# Patient Record
Sex: Male | Born: 1990 | Hispanic: No | Marital: Single | Smoking: Never smoker
Health system: Southern US, Community
[De-identification: ages and names within clinical notes are randomized; demographics above are authoritative.]

---
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
## 2011-03-12 ENCOUNTER — Institutional Professional Consult (permissible substitution) (INDEPENDENT_AMBULATORY_CARE_PROVIDER_SITE_OTHER): Payer: BC Managed Care – PPO | Admitting: Pediatrics

## 2011-03-12 DIAGNOSIS — F909 Attention-deficit hyperactivity disorder, unspecified type: Secondary | ICD-10-CM

## 2011-07-07 ENCOUNTER — Institutional Professional Consult (permissible substitution) (INDEPENDENT_AMBULATORY_CARE_PROVIDER_SITE_OTHER): Payer: BC Managed Care – PPO | Admitting: Pediatrics

## 2011-07-07 DIAGNOSIS — R279 Unspecified lack of coordination: Secondary | ICD-10-CM

## 2011-07-07 DIAGNOSIS — F909 Attention-deficit hyperactivity disorder, unspecified type: Secondary | ICD-10-CM

## 2013-10-26 ENCOUNTER — Other Ambulatory Visit (HOSPITAL_COMMUNITY): Payer: Self-pay | Admitting: Orthopaedic Surgery

## 2013-10-26 ENCOUNTER — Ambulatory Visit (HOSPITAL_COMMUNITY)
Admission: RE | Admit: 2013-10-26 | Discharge: 2013-10-26 | Disposition: A | Discharge status: Home / Self Care | Payer: BC Managed Care – PPO | Source: Ambulatory Visit | Attending: Orthopaedic Surgery | Admitting: Orthopaedic Surgery

## 2013-10-26 DIAGNOSIS — M25511 Pain in right shoulder: Secondary | ICD-10-CM

## 2013-10-26 DIAGNOSIS — Z1389 Encounter for screening for other disorder: Secondary | ICD-10-CM | POA: Insufficient documentation

## 2013-10-26 DIAGNOSIS — M25519 Pain in unspecified shoulder: Secondary | ICD-10-CM | POA: Insufficient documentation

## 2014-09-19 IMAGING — CR DG ORBITS FOR FOREIGN BODY
2 series · 2 of 2 positions shown · non-contrast
Comparison: none

CLINICAL DATA: Shoulder pain, pre MRI

EXAM:
ORBITS FOR FOREIGN BODY - 2 VIEW

[w waters (1 of 2)]
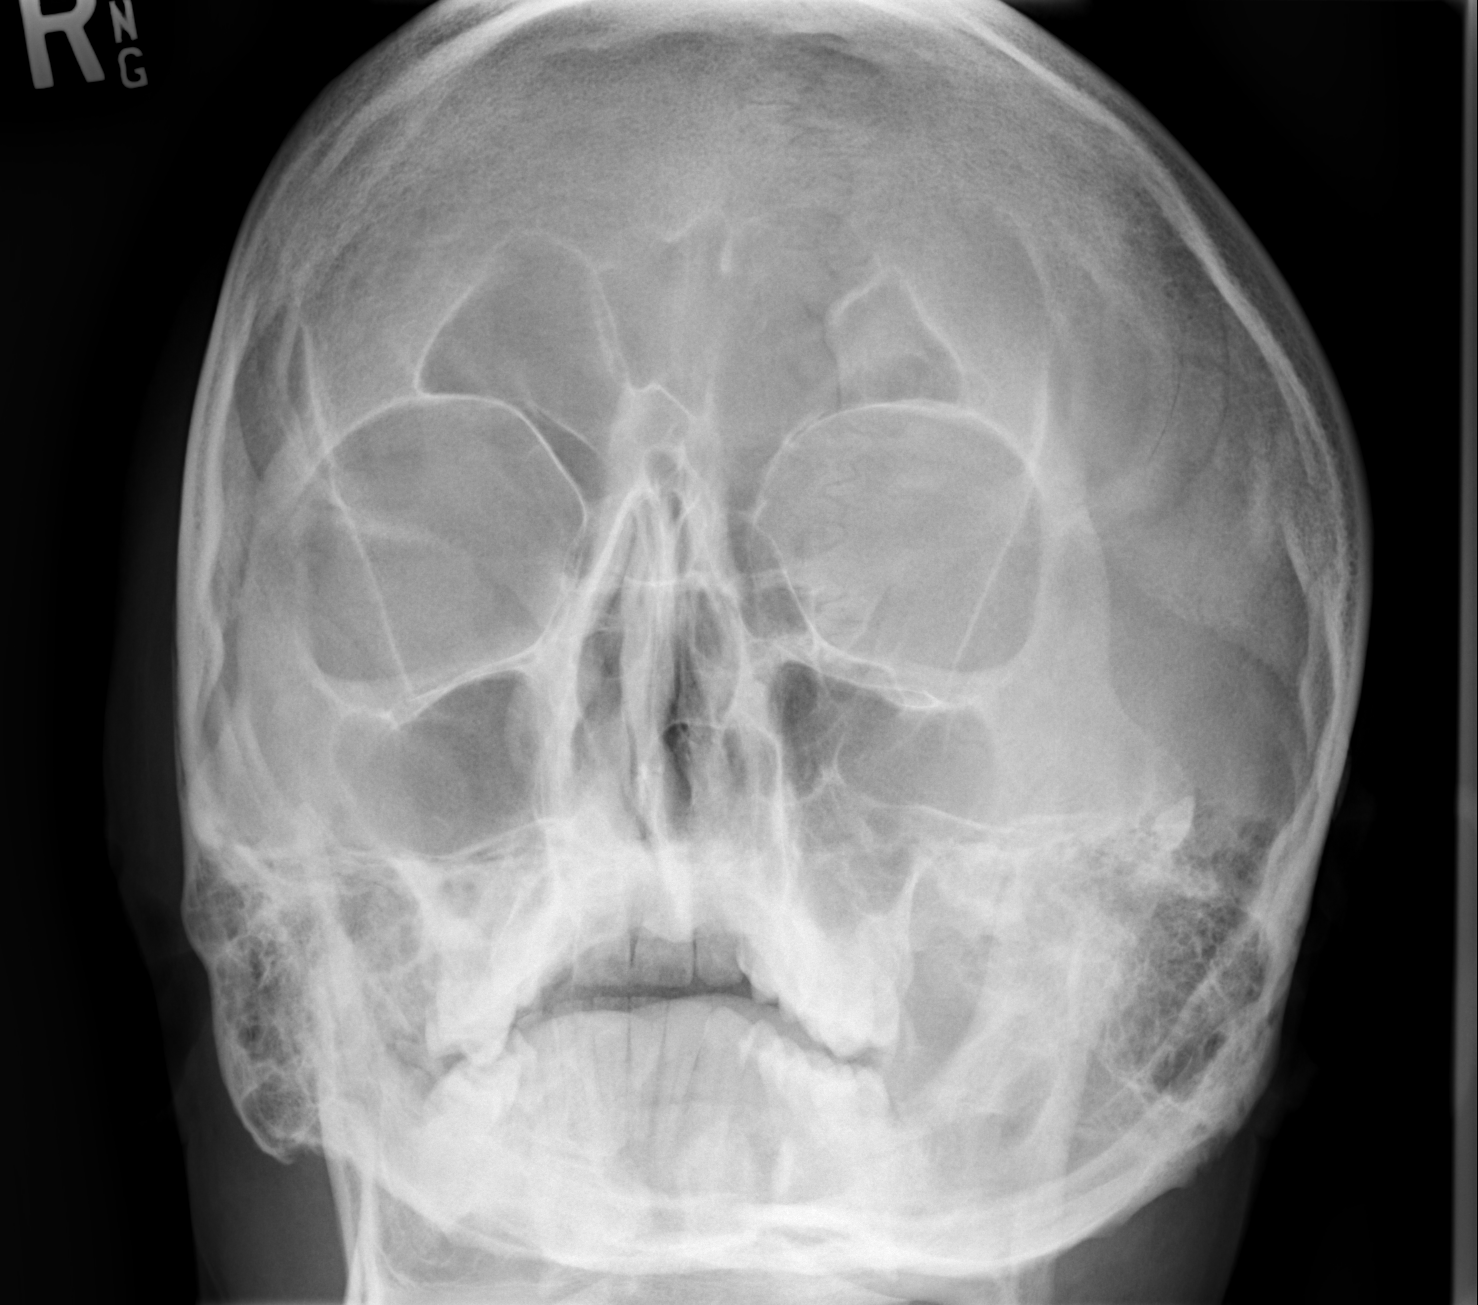

[w waters (2 of 2)]
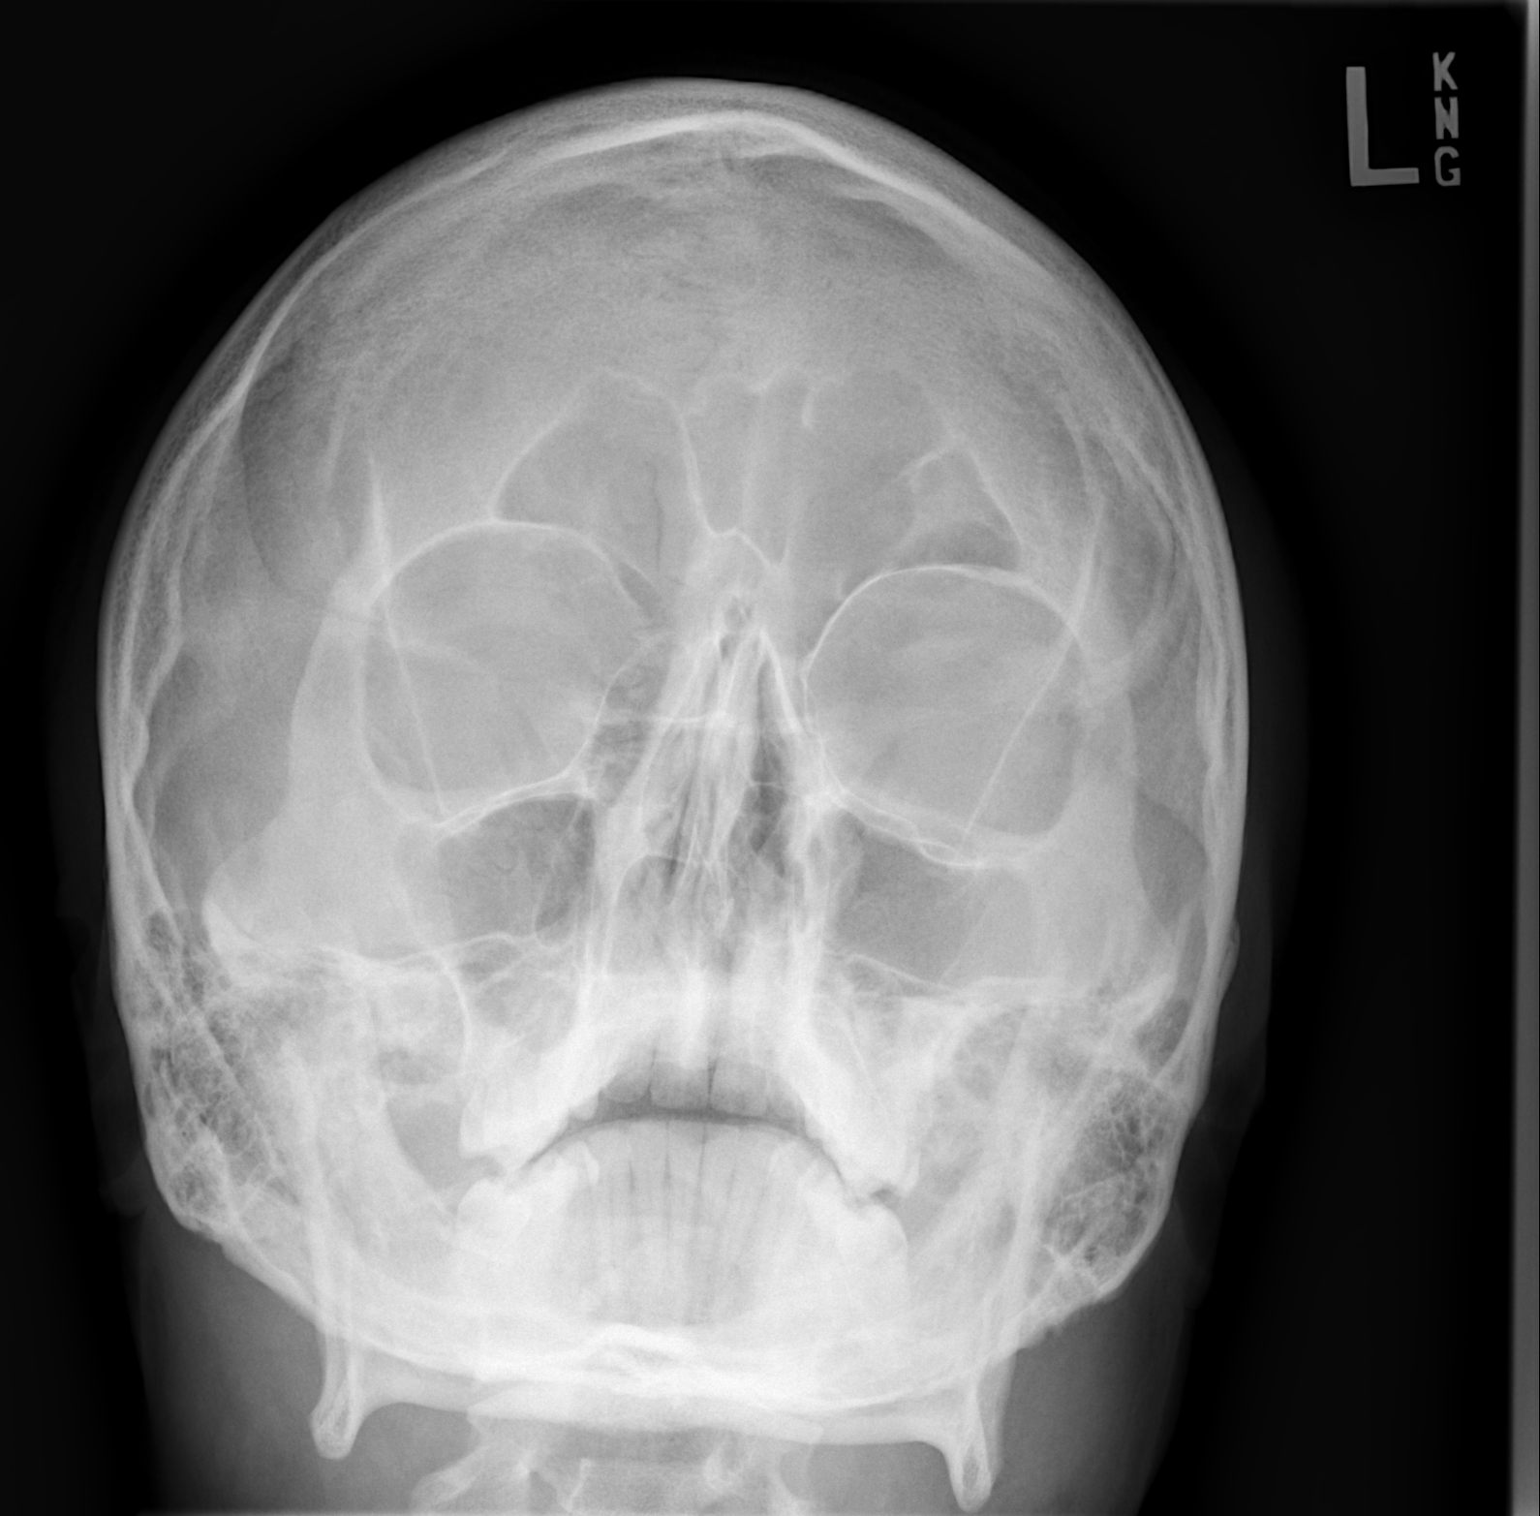

[2 of 2 positions shown; findings below may reference images not displayed]

FINDINGS: There is no evidence of metallic foreign body within the orbits. No
significant bone abnormality identified.
IMPRESSION: No evidence of metallic foreign body within the orbits.

## 2016-09-07 DIAGNOSIS — Z23 Encounter for immunization: Secondary | ICD-10-CM | POA: Diagnosis not present

## 2016-09-07 DIAGNOSIS — Z1389 Encounter for screening for other disorder: Secondary | ICD-10-CM | POA: Diagnosis not present

## 2016-09-07 DIAGNOSIS — Z6829 Body mass index (BMI) 29.0-29.9, adult: Secondary | ICD-10-CM | POA: Diagnosis not present

## 2016-09-07 DIAGNOSIS — F418 Other specified anxiety disorders: Secondary | ICD-10-CM | POA: Diagnosis not present

## 2017-03-18 DIAGNOSIS — Z6829 Body mass index (BMI) 29.0-29.9, adult: Secondary | ICD-10-CM | POA: Diagnosis not present

## 2017-03-18 DIAGNOSIS — E663 Overweight: Secondary | ICD-10-CM | POA: Diagnosis not present

## 2017-03-18 DIAGNOSIS — Z1389 Encounter for screening for other disorder: Secondary | ICD-10-CM | POA: Diagnosis not present

## 2017-03-18 DIAGNOSIS — F951 Chronic motor or vocal tic disorder: Secondary | ICD-10-CM | POA: Diagnosis not present

## 2017-03-18 DIAGNOSIS — F419 Anxiety disorder, unspecified: Secondary | ICD-10-CM | POA: Diagnosis not present

## 2017-08-05 DIAGNOSIS — F909 Attention-deficit hyperactivity disorder, unspecified type: Secondary | ICD-10-CM | POA: Diagnosis not present

## 2017-08-05 DIAGNOSIS — E6609 Other obesity due to excess calories: Secondary | ICD-10-CM | POA: Diagnosis not present

## 2017-08-05 DIAGNOSIS — Z1389 Encounter for screening for other disorder: Secondary | ICD-10-CM | POA: Diagnosis not present

## 2017-08-05 DIAGNOSIS — Z6830 Body mass index (BMI) 30.0-30.9, adult: Secondary | ICD-10-CM | POA: Diagnosis not present

## 2017-11-04 DIAGNOSIS — E663 Overweight: Secondary | ICD-10-CM | POA: Diagnosis not present

## 2017-11-04 DIAGNOSIS — Z1389 Encounter for screening for other disorder: Secondary | ICD-10-CM | POA: Diagnosis not present

## 2017-11-04 DIAGNOSIS — Z6829 Body mass index (BMI) 29.0-29.9, adult: Secondary | ICD-10-CM | POA: Diagnosis not present

## 2017-11-04 DIAGNOSIS — F909 Attention-deficit hyperactivity disorder, unspecified type: Secondary | ICD-10-CM | POA: Diagnosis not present

## 2018-03-24 DIAGNOSIS — Z0000 Encounter for general adult medical examination without abnormal findings: Secondary | ICD-10-CM | POA: Diagnosis not present

## 2018-03-24 DIAGNOSIS — R739 Hyperglycemia, unspecified: Secondary | ICD-10-CM | POA: Diagnosis not present

## 2018-03-24 DIAGNOSIS — Z1389 Encounter for screening for other disorder: Secondary | ICD-10-CM | POA: Diagnosis not present

## 2018-03-24 DIAGNOSIS — E663 Overweight: Secondary | ICD-10-CM | POA: Diagnosis not present

## 2018-03-24 DIAGNOSIS — E782 Mixed hyperlipidemia: Secondary | ICD-10-CM | POA: Diagnosis not present

## 2018-03-24 DIAGNOSIS — Z6828 Body mass index (BMI) 28.0-28.9, adult: Secondary | ICD-10-CM | POA: Diagnosis not present

## 2018-04-19 ENCOUNTER — Other Ambulatory Visit (HOSPITAL_COMMUNITY): Payer: Self-pay | Admitting: Family Medicine

## 2018-04-19 ENCOUNTER — Ambulatory Visit (HOSPITAL_COMMUNITY)
Admission: RE | Admit: 2018-04-19 | Discharge: 2018-04-19 | Disposition: A | Discharge status: Home / Self Care | Payer: 59 | Source: Ambulatory Visit | Attending: Family Medicine | Admitting: Family Medicine

## 2018-04-19 DIAGNOSIS — N433 Hydrocele, unspecified: Secondary | ICD-10-CM | POA: Insufficient documentation

## 2018-04-19 DIAGNOSIS — N5082 Scrotal pain: Secondary | ICD-10-CM | POA: Diagnosis not present

## 2018-04-19 DIAGNOSIS — E663 Overweight: Secondary | ICD-10-CM | POA: Diagnosis not present

## 2018-04-19 DIAGNOSIS — Z1389 Encounter for screening for other disorder: Secondary | ICD-10-CM | POA: Diagnosis not present

## 2018-04-19 DIAGNOSIS — N432 Other hydrocele: Secondary | ICD-10-CM | POA: Diagnosis not present

## 2018-05-11 DIAGNOSIS — E663 Overweight: Secondary | ICD-10-CM | POA: Diagnosis not present

## 2018-05-11 DIAGNOSIS — Z6828 Body mass index (BMI) 28.0-28.9, adult: Secondary | ICD-10-CM | POA: Diagnosis not present

## 2018-06-13 DIAGNOSIS — S0502XA Injury of conjunctiva and corneal abrasion without foreign body, left eye, initial encounter: Secondary | ICD-10-CM | POA: Diagnosis not present

## 2019-03-13 IMAGING — US US SCROTUM W/ DOPPLER COMPLETE
1 series · 14 of 25 positions shown · non-contrast
Comparison: None.

CLINICAL DATA: Right scrotal pain for 1 week.

EXAM:
SCROTAL ULTRASOUND
DOPPLER ULTRASOUND OF THE TESTICLES
TECHNIQUE: Complete ultrasound examination of the testicles, epididymis, and
other scrotal structures was performed. Color and spectral Doppler
ultrasound were also utilized to evaluate blood flow to the
testicles.

[Series 1: us scrotum w/ doppler complete · 14 of 80 slices shown]
[im 1/80]
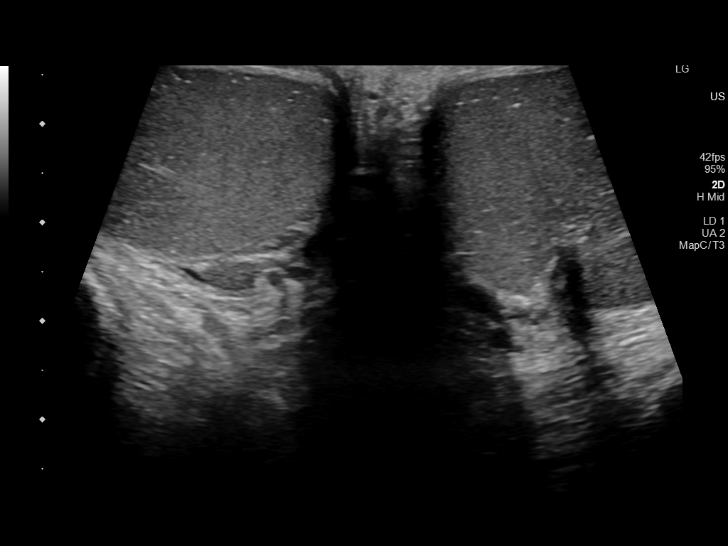
[im 7/80]
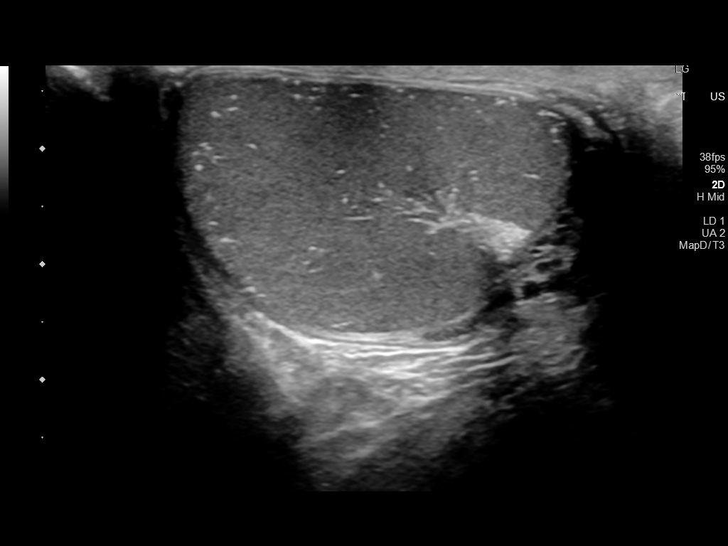
[im 14/80]
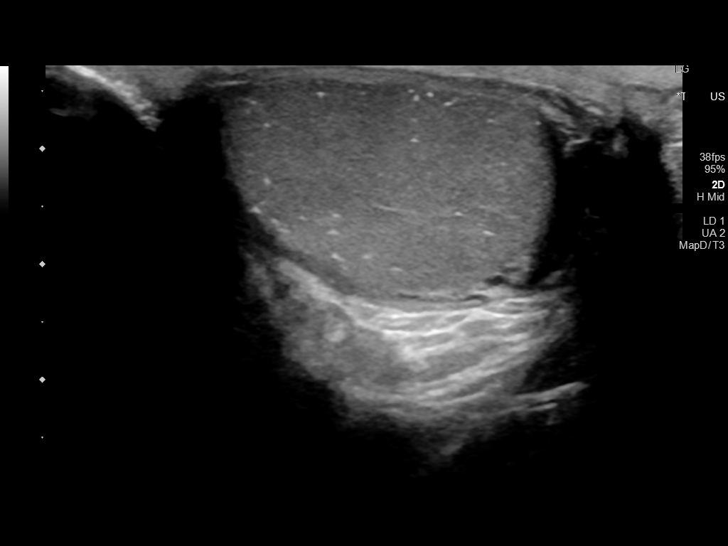
[im 20/80]
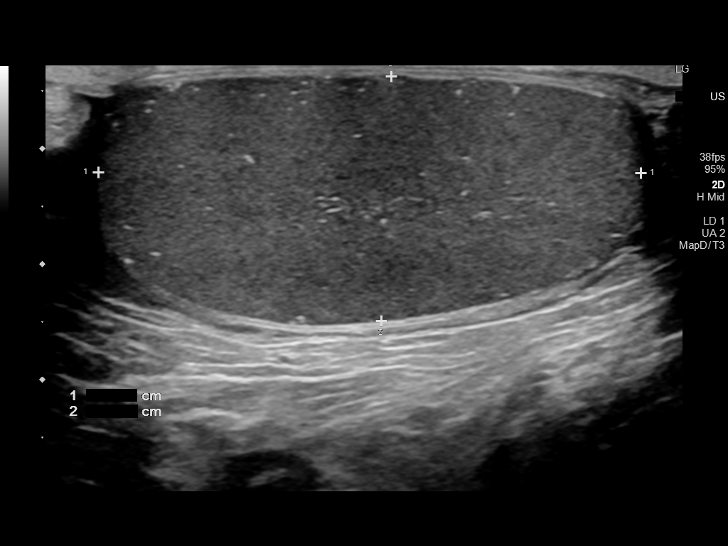
[im 27/80]
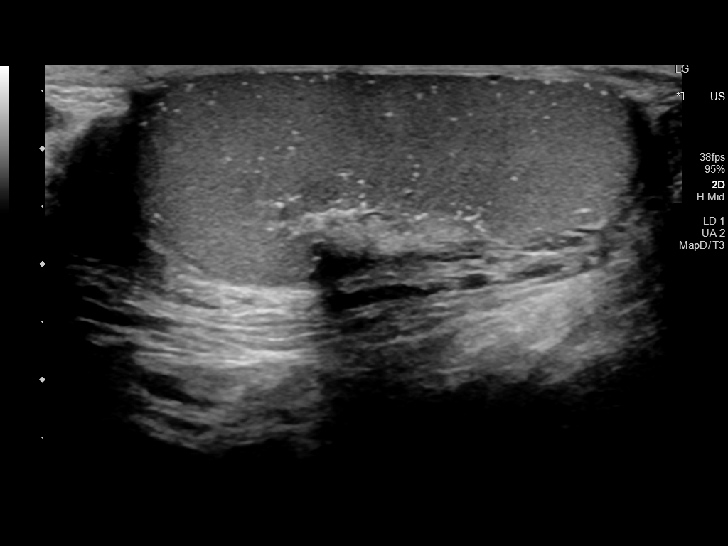
[im 30/80]
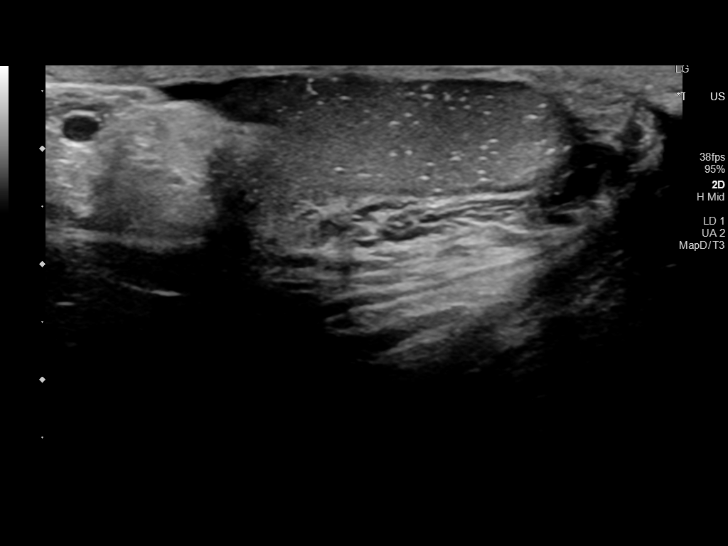
[im 37/80]
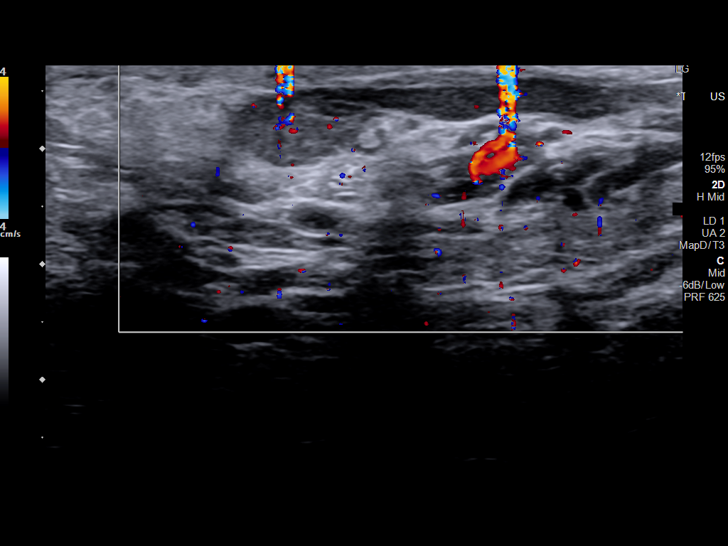
[im 43/80]
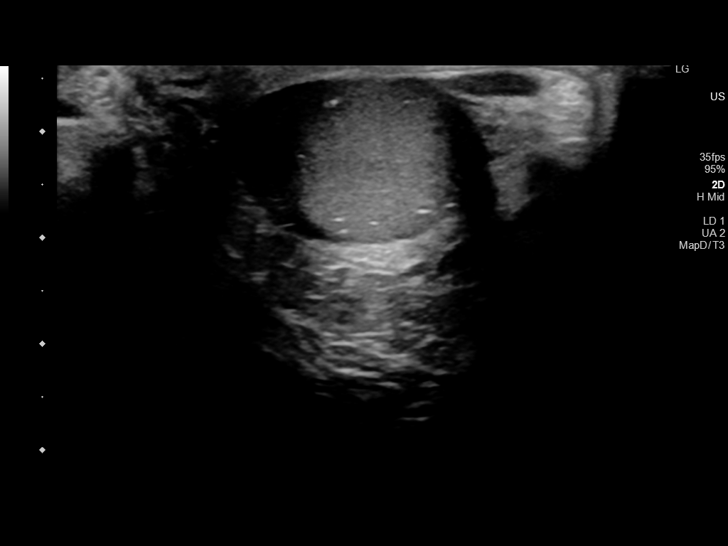
[im 50/80]
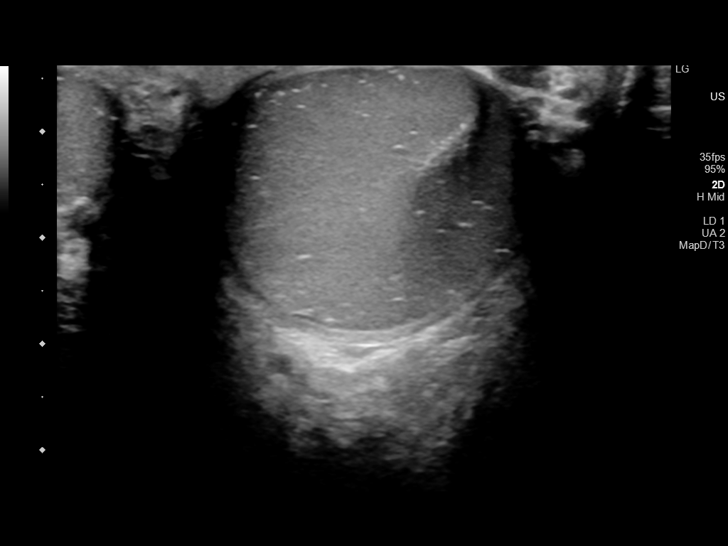
[im 53/80]
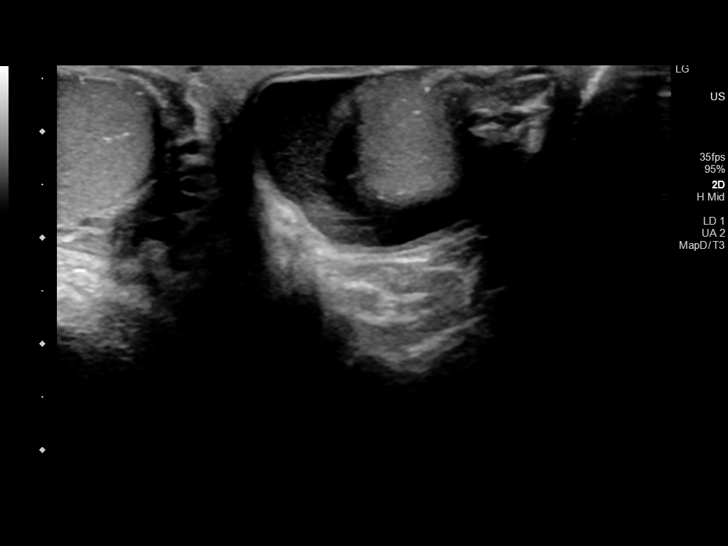
[im 60/80]
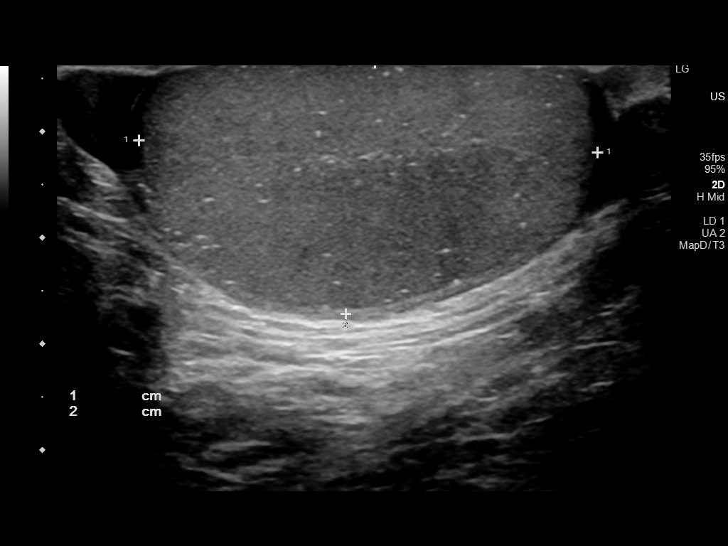
[im 66/80]
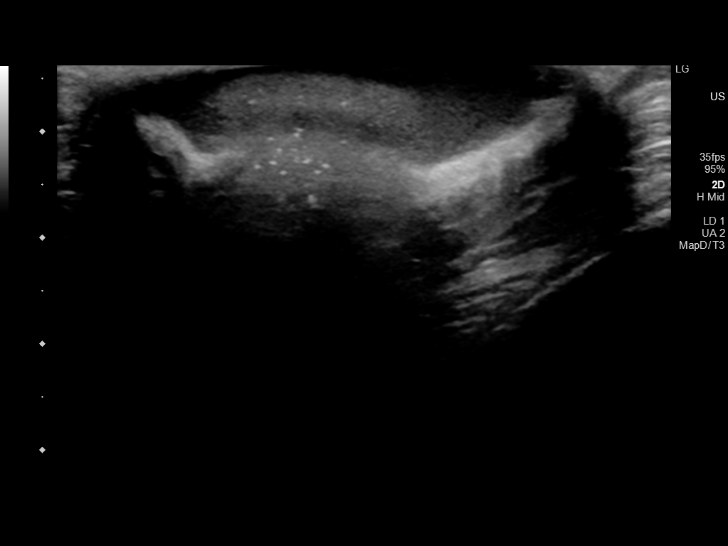
[im 73/80]
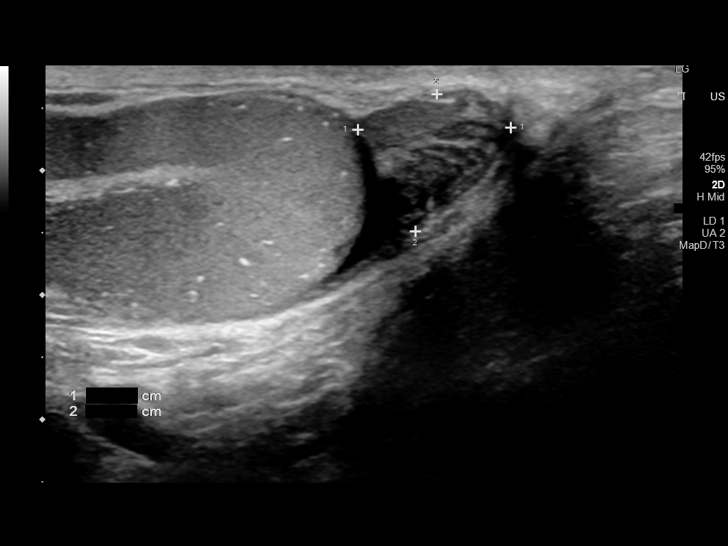
[im 80/80]
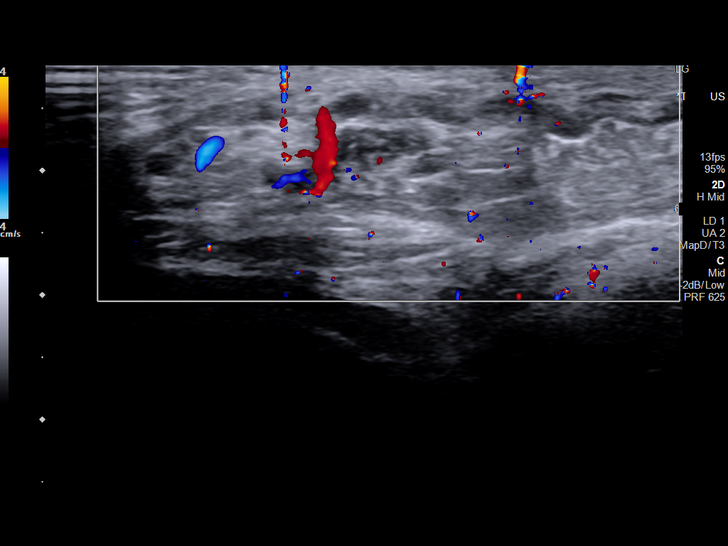

[14 of 25 positions shown; findings below may reference images not displayed]

FINDINGS: Right testicle

Measurements: 4.7 x 2.1 x 3.4 cm. No mass. Diffuse microlithiasis
noted.

Left testicle

Measurements: 4.3 x 2.4 x 2.8 cm. No mass. Diffuse microlithiasis
visualized.

Right epididymis:  Normal in size and appearance.

Left epididymis:  Normal in size and appearance.

Hydrocele:  Bilateral small hydrocele.

Varicocele:  None visualized.

Pulsed Doppler interrogation of both testes demonstrates normal low
resistance arterial and venous waveforms bilaterally.
IMPRESSION: Bilateral testicular microlithiasis.

Small bilateral hydrocele.

## 2019-04-12 ENCOUNTER — Ambulatory Visit (INDEPENDENT_AMBULATORY_CARE_PROVIDER_SITE_OTHER): Payer: 59 | Admitting: Adult Health

## 2019-04-12 ENCOUNTER — Other Ambulatory Visit: Payer: Self-pay

## 2019-04-12 ENCOUNTER — Encounter: Payer: Self-pay | Admitting: Adult Health

## 2019-04-12 VITALS — BP 145/89 | HR 72 | Ht 72.0 in | Wt 202.0 lb

## 2019-04-12 DIAGNOSIS — F331 Major depressive disorder, recurrent, moderate: Secondary | ICD-10-CM

## 2019-04-12 DIAGNOSIS — F411 Generalized anxiety disorder: Secondary | ICD-10-CM

## 2019-04-12 DIAGNOSIS — F902 Attention-deficit hyperactivity disorder, combined type: Secondary | ICD-10-CM | POA: Diagnosis not present

## 2019-04-12 DIAGNOSIS — F429 Obsessive-compulsive disorder, unspecified: Secondary | ICD-10-CM | POA: Diagnosis not present

## 2019-04-12 MED ORDER — FLUOXETINE HCL 20 MG PO CAPS: 20.0000 mg | ORAL_CAPSULE | Freq: Every day | ORAL | 2 refills | Status: DC

## 2019-04-12 NOTE — Progress Notes (Signed)
[facility] MD/PA/NP Initial Note  04/12/2019 8:45 AM Nicholas Bruce  MRN:  [id number]  Chief Complaint:  Chief Complaint    ADHD; Anxiety; Other     Visit Diagnosis: OCD, anxiety, ADHD  HPI:   Describes mood today as "ok". Pleasant. Mood symptoms - reports depression, anxiety, and irritability, but mostly because of his "OCD". Checking doors at night - "will check it 10 times in a row". Pulls eyelashes out - has been doing it since he got married. Stating "I have none". Squinting eyes because he thinks they are dry. Wife notes he makes noises' History of biting nails. After washing hands, makes sure faucets are off. Counts to 12 when "messing with face". Denies any ritual or strict routines. Having issues focusing on son - "wife getting frustrated with me". Stable interest and motivation. Taking medications as prescribed.  Energy levels mostly stable. Active, has a regular exercise routine - running - weight lifting. Works as an Warden/rangerengineering inspector.  Enjoys some usual interests and activities. Spending time with family - wife and 27 year old son - dog. Playing video games.  Appetite adequate. Weight stable. Sleeps well most nights. Averages 6 to 7 hours. Focus and concentration difficulties. Diagnosed with ADHD when 15. Has taken various medications over the years. Last trialed on 54mg  of Concerta with PCP. Stopped taking 7 to 8 months ago. Completing tasks. Managing aspects of household.  Denies SI or HI. Denies AH or VH.   Past Psychiatric History: Denies  Past Medical History: No past medical history on file.   Family Psychiatric History: Father - OCD  Family History: History reviewed. No pertinent family history.  Social History:  Social History   Socioeconomic History  . Marital status: Single    Spouse name: Not on file  . Number of children: Not on file  . Years of education: Not on file  . Highest education level: Not on file  Occupational History  . Not on file   Social Needs  . Financial resource strain: Not on file  . Food insecurity    Worry: Not on file    Inability: Not on file  . Transportation needs    Medical: Not on file    Non-medical: Not on file  Tobacco Use  . Smoking status: Never Smoker  . Smokeless tobacco: Never Used  Substance and Sexual Activity  . Alcohol use: Not on file  . Drug use: Not on file  . Sexual activity: Not on file  Lifestyle  . Physical activity    Days per week: Not on file    Minutes per session: Not on file  . Stress: Not on file  Relationships  . Social Musicianconnections    Talks on phone: Not on file    Gets together: Not on file    Attends religious service: Not on file    Active member of club or organization: Not on file    Attends meetings of clubs or organizations: Not on file    Relationship status: Not on file  Other Topics Concern  . Not on file  Social History Narrative  . Not on file    Allergies: No Known Allergies  Metabolic Disorder Labs: No results found for: HGBA1C, MPG No results found for: PROLACTIN No results found for: CHOL, TRIG, HDL, CHOLHDL, VLDL, LDLCALC No results found for: TSH  Therapeutic Level Labs: No results found for: LITHIUM No results found for: VALPROATE No components found for:  CBMZ  Current Medications: No  current outpatient medications on file.   No current facility-administered medications for this visit.     Medication Side Effects: none  Orders placed this visit:  No orders of the defined types were placed in this encounter.   Psychiatric Specialty Exam:  ROS  Blood pressure (!) 145/89, pulse 72, height 6' (1.829 m), weight 202 lb (91.6 kg).Body mass index is 27.4 kg/m.  General Appearance: Neat and Well Groomed  Eye Contact:  Good  Speech:  Normal Rate  Volume:  Normal  Mood:  Anxious, Depressed and Irritable  Affect:  Non-Congruent  Thought Process:  Coherent  Orientation:  Full (Time, Place, and Person)  Thought Content:  Logical   Suicidal Thoughts:  No  Homicidal Thoughts:  No  Memory:  WNL  Judgement:  Good  Insight:  Negative  Psychomotor Activity:  Normal  Concentration:  Concentration: Good  Recall:  Good  Fund of Knowledge: Good  Language: Good  Assets:  Communication Skills Desire for Improvement Financial Resources/Insurance Housing Intimacy Leisure Time Physical Health Resilience Social Support Talents/Skills Transportation Vocational/Educational  ADL's:  Intact  Cognition: WNL  Prognosis:  Good   Screenings: None  Receiving Psychotherapy: No   Treatment Plan/Recommendations:  Plan:  1. Add Prozac 20mg  capsule   RTC 4 weeks  Patient advised to contact office with any questions, adverse effects, or acute worsening in signs and symptoms.    Aloha Gell, NP

## 2019-05-10 ENCOUNTER — Encounter: Payer: Self-pay | Admitting: Adult Health

## 2019-05-10 ENCOUNTER — Other Ambulatory Visit: Payer: Self-pay

## 2019-05-10 ENCOUNTER — Ambulatory Visit (INDEPENDENT_AMBULATORY_CARE_PROVIDER_SITE_OTHER): Payer: 59 | Admitting: Adult Health

## 2019-05-10 DIAGNOSIS — F902 Attention-deficit hyperactivity disorder, combined type: Secondary | ICD-10-CM

## 2019-05-10 DIAGNOSIS — F411 Generalized anxiety disorder: Secondary | ICD-10-CM

## 2019-05-10 DIAGNOSIS — F429 Obsessive-compulsive disorder, unspecified: Secondary | ICD-10-CM

## 2019-05-10 DIAGNOSIS — F331 Major depressive disorder, recurrent, moderate: Secondary | ICD-10-CM

## 2019-05-10 MED ORDER — FLUOXETINE HCL 40 MG PO CAPS: 40.0000 mg | ORAL_CAPSULE | Freq: Every day | ORAL | 2 refills | Status: DC

## 2019-05-10 NOTE — Progress Notes (Signed)
[facility] MD/PA/NP Progress Note  05/10/2019 8:19 AM Nicholas Bruce  MRN:  [id number]  Chief Complaint:  Chief Complaint    ADHD; Anxiety; Depression; Other     Visit Diagnosis: OCD, anxiety, ADHD.  HPI:   Describes mood today as "ok". Pleasant. Mood symptoms - reports decreased depression, anxiety, and irritability.Things going "pretty well". Wife has noticed some differences. Feels better "not so worrisome". Had headaches first few days, then "fine". Finds himself not checking on doors as much. Does still count - "nothing like it was. Has stopped pulling out his eyelashes. Decreased "tics". Squinting eyes "here and there". Has stopped biting nails. Has been able to focus on son more. Stable interest and motivation. Taking medications as prescribed.  Energy levels mostly stable. Active, has a regular exercise routine - going to the gym every day.  Works as an Electrical engineer.  Enjoys some usual interests and activities. Spending time with family - wife and 49 year old son - dog.  Appetite adequate. Weight stable. Sleeps well most nights. Averages 7 to 8 hours. Focus and concentration difficulties. Diagnosed with ADHD when 15. Has taken various medications over the years. Completing tasks. Managing aspects of household. Work going well.  Denies SI or HI. Denies AH or VH.  Past Psychiatric History: Denies  Past Medical History: History reviewed. No pertinent past medical history.   Family Psychiatric History: Father - OCD  Family History: History reviewed. No pertinent family history.  Social History:  Social History   Socioeconomic History  . Marital status: Single    Spouse name: Not on file  . Number of children: Not on file  . Years of education: Not on file  . Highest education level: Not on file  Occupational History  . Not on file  Social Needs  . Financial resource strain: Not on file  . Food insecurity    Worry: Not on file    Inability: Not on file  .  Transportation needs    Medical: Not on file    Non-medical: Not on file  Tobacco Use  . Smoking status: Never Smoker  . Smokeless tobacco: Never Used  Substance and Sexual Activity  . Alcohol use: Not on file  . Drug use: Not on file  . Sexual activity: Not on file  Lifestyle  . Physical activity    Days per week: Not on file    Minutes per session: Not on file  . Stress: Not on file  Relationships  . Social Herbalist on phone: Not on file    Gets together: Not on file    Attends religious service: Not on file    Active member of club or organization: Not on file    Attends meetings of clubs or organizations: Not on file    Relationship status: Not on file  Other Topics Concern  . Not on file  Social History Narrative  . Not on file    Allergies: No Known Allergies  Metabolic Disorder Labs: No results found for: HGBA1C, MPG No results found for: PROLACTIN No results found for: CHOL, TRIG, HDL, CHOLHDL, VLDL, LDLCALC No results found for: TSH  Therapeutic Level Labs: No results found for: LITHIUM No results found for: VALPROATE No components found for:  CBMZ  Current Medications: Current Outpatient Medications  Medication Sig Dispense Refill  . FLUoxetine (PROZAC) 40 MG capsule Take 1 capsule (40 mg total) by mouth daily. 30 capsule 2   No current facility-administered medications for  this visit.     Medication Side Effects: none  Orders placed this visit:  No orders of the defined types were placed in this encounter.   Psychiatric Specialty Exam:  ROS  There were no vitals taken for this visit.There is no height or weight on file to calculate BMI.  General Appearance: Neat and Well Groomed  Eye Contact:  Good  Speech:  Normal Rate  Volume:  Normal  Mood:  Euthymic  Affect:  Non-Congruent  Thought Process:  Coherent  Orientation:  Full (Time, Place, and Person)  Thought Content: Logical   Suicidal Thoughts:  No  Homicidal Thoughts:  No   Memory:  WNL  Judgement:  Good  Insight:  Negative  Psychomotor Activity:  Normal  Concentration:  Concentration: Good  Recall:  Good  Fund of Knowledge: Good  Language: Good  Assets:  Communication Skills Desire for Improvement Financial Resources/Insurance Housing Intimacy Leisure Time Physical Health Resilience Social Support Talents/Skills Transportation Vocational/Educational  ADL's:  Intact  Cognition: WNL  Prognosis:  Good   Screenings: None  Receiving Psychotherapy: No   Treatment Plan/Recommendations:  Plan:  1. Increase Prozac 20mg  to 40mg  capsule daily  RTC 4 weeks  Patient advised to contact office with any questions, adverse effects, or acute worsening in signs and symptoms.    , NP

## 2019-06-07 ENCOUNTER — Other Ambulatory Visit: Payer: Self-pay

## 2019-06-07 ENCOUNTER — Encounter: Payer: Self-pay | Admitting: Adult Health

## 2019-06-07 ENCOUNTER — Ambulatory Visit (INDEPENDENT_AMBULATORY_CARE_PROVIDER_SITE_OTHER): Payer: 59 | Admitting: Adult Health

## 2019-06-07 DIAGNOSIS — F429 Obsessive-compulsive disorder, unspecified: Secondary | ICD-10-CM

## 2019-06-07 DIAGNOSIS — F331 Major depressive disorder, recurrent, moderate: Secondary | ICD-10-CM | POA: Diagnosis not present

## 2019-06-07 DIAGNOSIS — F909 Attention-deficit hyperactivity disorder, unspecified type: Secondary | ICD-10-CM | POA: Diagnosis not present

## 2019-06-07 DIAGNOSIS — F411 Generalized anxiety disorder: Secondary | ICD-10-CM

## 2019-06-07 MED ORDER — FLUOXETINE HCL 40 MG PO CAPS: 40.0000 mg | ORAL_CAPSULE | Freq: Every day | ORAL | 1 refills | Status: DC

## 2019-06-07 NOTE — Progress Notes (Signed)
[facility] MD/PA/NP Progress Note  06/07/2019 8:33 AM Nicholas Bruce  MRN:  [id number]  Chief Complaint:  Chief Complaint    Anxiety; Depression; ADHD; Other     Visit Diagnosis: OCD, anxiety, depression, ADHD.  HPI:   Describes mood today as "ok". Pleasant. Mood symptoms - denies depression, anxiety, and irritability. Stating "I'm doing much better". Not "worrying" as much about things. Not checking on doors.Still "counting here and there" - "maybe once or twice a week". Stopped pulling out his eyelashes. Denies "tics". No longer "squinting eyes". Denies biting nails. Denies panic attacks. Stable interest and motivation. Taking medications as prescribed.  Energy levels mostly stable. Active, has a regular exercise routine - going to the gym 4 to 5 days a week. 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 Spending time with family - wife and 60 year old son - dog. Family local. Upcoming trip to [place].  Appetite adequate. Weight stable. Sleeps well most nights. Averages 7 to 8 hours. Focus and concentration difficulties. Diagnosed with ADHD when 15. Completing tasks. Managing aspects of household. Work going well.  Denies SI or HI. Denies AH or VH.  Past Psychiatric History: Denies  Past Medical History: No past medical history on file.   Family Psychiatric History: Father - OCD  Family History: No family history on file.  Social History:  Social History   Socioeconomic History  . Marital status: Single    Spouse name: Not on file  . Number of children: Not on file  . Years of education: Not on file  . Highest education level: Not on file  Occupational History  . Not on file  Social Needs  . Financial resource strain: Not on file  . Food insecurity    Worry: Not on file    Inability: Not on file  . Transportation needs    Medical: Not on file    Non-medical: Not on file  Tobacco Use  . Smoking status: Never Smoker  . Smokeless  tobacco: Never Used  Substance and Sexual Activity  . Alcohol use: Not on file  . Drug use: Not on file  . Sexual activity: Not on file  Lifestyle  . Physical activity    Days per week: Not on file    Minutes per session: Not on file  . Stress: Not on file  Relationships  . Social Musician on phone: Not on file    Gets together: Not on file    Attends religious service: Not on file    Active member of club or organization: Not on file    Attends meetings of clubs or organizations: Not on file    Relationship status: Not on file  Other Topics Concern  . Not on file  Social History Narrative  . Not on file    Allergies: No Known Allergies  Metabolic Disorder Labs: No results found for: HGBA1C, MPG No results found for: PROLACTIN No results found for: CHOL, TRIG, HDL, CHOLHDL, VLDL, LDLCALC No results found for: TSH  Therapeutic Level Labs: No results found for: LITHIUM No results found for: VALPROATE No components found for:  CBMZ  Current Medications: Current Outpatient Medications  Medication Sig Dispense Refill  . FLUoxetine (PROZAC) 40 MG capsule Take 1 capsule (40 mg total) by mouth daily. 30 capsule 2   No current facility-administered medications for this visit.     Medication Side Effects: none  Orders placed this visit:  No orders  of the defined types were placed in this encounter.   Psychiatric Specialty Exam:  Review of Systems  Neurological: Negative for tremors and weakness.    There were no vitals taken for this visit.There is no height or weight on file to calculate BMI.  General Appearance: Neat and Well Groomed  Eye Contact:  Good  Speech:  Normal Rate  Volume:  Normal  Mood:  Euthymic  Affect:  Non-Congruent  Thought Process:  Coherent  Orientation:  Full (Time, Place, and Person)  Thought Content: Logical   Suicidal Thoughts:  No  Homicidal Thoughts:  No  Memory:  WNL  Judgement:  Good  Insight:  Negative  Psychomotor  Activity:  Normal  Concentration:  Concentration: Good  Recall:  Good  Fund of Knowledge: Good  Language: Good  Assets:  Communication Skills Desire for Improvement Financial Resources/Insurance Housing Intimacy Leisure Time Physical Health Resilience Social Support Talents/Skills Transportation Vocational/Educational  ADL's:  Intact  Cognition: WNL  Prognosis:  Good   Screenings: None  Receiving Psychotherapy: No   Treatment Plan/Recommendations:  Plan:  1. Continue Prozac 40mg  capsule daily  RTC 3 months   Patient advised to contact office with any questions, adverse effects, or acute worsening in signs and symptoms.    Aloha Gell, NP

## 2019-09-06 ENCOUNTER — Encounter: Payer: Self-pay | Admitting: Adult Health

## 2019-09-06 ENCOUNTER — Ambulatory Visit (INDEPENDENT_AMBULATORY_CARE_PROVIDER_SITE_OTHER): Payer: 59 | Admitting: Adult Health

## 2019-09-06 ENCOUNTER — Other Ambulatory Visit: Payer: Self-pay

## 2019-09-06 DIAGNOSIS — F902 Attention-deficit hyperactivity disorder, combined type: Secondary | ICD-10-CM | POA: Diagnosis not present

## 2019-09-06 DIAGNOSIS — F429 Obsessive-compulsive disorder, unspecified: Secondary | ICD-10-CM

## 2019-09-06 DIAGNOSIS — F411 Generalized anxiety disorder: Secondary | ICD-10-CM

## 2019-09-06 DIAGNOSIS — F331 Major depressive disorder, recurrent, moderate: Secondary | ICD-10-CM

## 2019-09-06 MED ORDER — FLUOXETINE HCL 20 MG PO CAPS: ORAL_CAPSULE | ORAL | 5 refills | Status: DC

## 2019-09-06 NOTE — Progress Notes (Signed)
Nicholas Bruce [id number] Apr 12, 1991 29 y.o.  Subjective:   Patient ID:  Nicholas Bruce is a 29 y.o. (DOB 12-12-1990) male.  Chief Complaint:  Chief Complaint  Patient presents with  . Anxiety  . Depression  . ADHD  . Other    OCD    HPI   Nicholas Bruce presents to the office today for follow-up of MDD, ADHD, OCD, and ADHD.  Describes mood today as "so ". Pleasant. Mood symptoms - reports depression, anxiety, and irritability. Stating "I'm feeling overwhelmed and stressed right now". Had been feeling great until he started having increased situational stressors. Grandfather passed away yesterday. Concerned about election results. Moving into a new house. Has picked at eyelashes "some". Started "checking" things more than he was. Denies "tics" or "squinting eyes". Denies biting nails. Denies panic attacks. Stable interest and motivation. Taking medications as prescribed.  Energy levels mostly stable. Active, has a regular exercise routine - going to the gym 4 to 5 days a week. 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 Married. Lives with wife and 30 and 61/55 year old son - dog. Family local and supportive. Recent trip to [place].  Appetite adequate. Weight stable - 212. Sleeps well most nights. Averages 7 to 8 hours. Focus and concentration difficulties. Diagnosed with ADHD when 15 - does not won't to take medications. Completing tasks. Managing aspects of household. Work going well.  Denies SI or HI. Denies AH or VH.  Review of Systems:  Review of Systems  Musculoskeletal: Negative for gait problem.  Neurological: Negative for tremors.  Psychiatric/Behavioral:       Please refer to HPI    Medications: I have reviewed the patient's current medications.  Current Outpatient Medications  Medication Sig Dispense Refill  . fluorometholone (FML) 0.1 % ophthalmic suspension 1 drop 3 (three) times daily.    Marland Kitchen FLUoxetine (PROZAC) 20 MG capsule Take  three capsules daily. 90 capsule 5   No current facility-administered medications for this visit.    Medication Side Effects: None  Allergies: No Known Allergies  No past medical history on file.  No family history on file.  Social History   Socioeconomic History  . Marital status: Single    Spouse name: Not on file  . Number of children: Not on file  . Years of education: Not on file  . Highest education level: Not on file  Occupational History  . Not on file  Tobacco Use  . Smoking status: Never Smoker  . Smokeless tobacco: Never Used  Substance and Sexual Activity  . Alcohol use: Not on file  . Drug use: Not on file  . Sexual activity: Not on file  Other Topics Concern  . Not on file  Social History Narrative  . Not on file   Social Determinants of Health   Financial Resource Strain:   . Difficulty of Paying Living Expenses: Not on file  Food Insecurity:   . Worried About Programme researcher, broadcasting/film/video in the Last Year: Not on file  . Ran Out of Food in the Last Year: Not on file  Transportation Needs:   . Lack of Transportation (Medical): Not on file  . Lack of Transportation (Non-Medical): Not on file  Physical Activity:   . Days of Exercise per Week: Not on file  . Minutes of Exercise per Session: Not on file  Stress:   . Feeling of Stress : Not on file  Social Connections:   .  Frequency of Communication with Friends and Family: Not on file  . Frequency of Social Gatherings with Friends and Family: Not on file  . Attends Religious Services: Not on file  . Active Member of Clubs or Organizations: Not on file  . Attends Archivist Meetings: Not on file  . Marital Status: Not on file  Intimate Partner Violence:   . Fear of Current or Ex-Partner: Not on file  . Emotionally Abused: Not on file  . Physically Abused: Not on file  . Sexually Abused: Not on file    Past Medical History, Surgical history, Social history, and Family history were reviewed and  updated as appropriate.   Please see review of systems for further details on the patient's review from today.   Objective:   Physical Exam:  There were no vitals taken for this visit.  Physical Exam Constitutional:      General: He is not in acute distress.    Appearance: He is well-developed.  Musculoskeletal:        General: No deformity.  Neurological:     Mental Status: He is alert and oriented to person, place, and time.     Coordination: Coordination normal.  Psychiatric:        Attention and Perception: Attention and perception normal. He does not perceive auditory or visual hallucinations.        Mood and Affect: Mood normal. Mood is not anxious or depressed. Affect is not labile, blunt, angry or inappropriate.        Speech: Speech normal.        Behavior: Behavior normal.        Thought Content: Thought content normal. Thought content is not paranoid or delusional. Thought content does not include homicidal or suicidal ideation. Thought content does not include homicidal or suicidal plan.        Cognition and Memory: Cognition and memory normal.        Judgment: Judgment normal.     Comments: Insight intact     Lab Review:  No results found for: NA, K, CL, CO2, GLUCOSE, BUN, CREATININE, CALCIUM, PROT, ALBUMIN, AST, ALT, ALKPHOS, BILITOT, GFRNONAA, GFRAA  No results found for: WBC, RBC, HGB, HCT, PLT, MCV, MCH, MCHC, RDW, LYMPHSABS, MONOABS, EOSABS, BASOSABS  No results found for: POCLITH, LITHIUM   No results found for: PHENYTOIN, PHENOBARB, VALPROATE, CBMZ   .res Assessment: Plan:    1. Continue Prozac 40mg  to 60mg  capsule daily - increased OCD symptoms.  RTC 3 months   Patient advised to contact office with any questions, adverse effects, or acute worsening in signs and symptoms.  Aden was seen today for anxiety, depression, adhd and other.  Diagnoses and all orders for this visit:  Attention deficit hyperactivity disorder (ADHD), combined  type  Obsessive-compulsive disorder, unspecified type -     FLUoxetine (PROZAC) 20 MG capsule; Take three capsules daily.  Major depressive disorder, recurrent episode, moderate (HCC) -     FLUoxetine (PROZAC) 20 MG capsule; Take three capsules daily.  Generalized anxiety disorder -     FLUoxetine (PROZAC) 20 MG capsule; Take three capsules daily.     Please see After Visit Summary for patient specific instructions.  Future Appointments  Date Time Provider Ivy  12/05/2019  8:20 AM Elpidia Karn, Berdie Ogren, NP [facility] None    No orders of the defined types were placed in this encounter.   -------------------------------

## 2019-09-17 ENCOUNTER — Ambulatory Visit: Payer: 59 | Attending: Internal Medicine

## 2019-09-17 ENCOUNTER — Other Ambulatory Visit: Payer: Self-pay

## 2019-09-17 DIAGNOSIS — Z20822 Contact with and (suspected) exposure to covid-19: Secondary | ICD-10-CM

## 2019-09-18 ENCOUNTER — Telehealth: Payer: Self-pay

## 2019-09-18 LAB — NOVEL CORONAVIRUS, NAA: SARS-CoV-2, NAA: NOT DETECTED

## 2019-09-18 NOTE — Telephone Encounter (Signed)
Patient called and was told that his test for COVID-19 was negative 09/17/19. He states he was tested because he was around someone with fever and symptoms and they have not received their result. He was advised to continue quarantine until the sick person has received their test result  If they are positive he should continue quarantine for 14 days post exposure to the positive patient.  He verbalized understanding.

## 2019-09-20 ENCOUNTER — Ambulatory Visit: Payer: 59 | Attending: Internal Medicine

## 2019-09-20 ENCOUNTER — Other Ambulatory Visit: Payer: Self-pay

## 2019-09-20 DIAGNOSIS — Z20822 Contact with and (suspected) exposure to covid-19: Secondary | ICD-10-CM

## 2019-09-21 ENCOUNTER — Telehealth: Payer: Self-pay | Admitting: *Deleted

## 2019-09-21 LAB — NOVEL CORONAVIRUS, NAA: SARS-CoV-2, NAA: NOT DETECTED

## 2019-09-21 NOTE — Telephone Encounter (Signed)
Pt called for COVID test results, informed that lab shows Not Detected. Questions answered, pt verbalizes understanding.  

## 2019-12-05 ENCOUNTER — Encounter: Payer: Self-pay | Admitting: Adult Health

## 2019-12-05 ENCOUNTER — Ambulatory Visit (INDEPENDENT_AMBULATORY_CARE_PROVIDER_SITE_OTHER): Payer: 59 | Admitting: Adult Health

## 2019-12-05 ENCOUNTER — Other Ambulatory Visit: Payer: Self-pay

## 2019-12-05 DIAGNOSIS — F429 Obsessive-compulsive disorder, unspecified: Secondary | ICD-10-CM

## 2019-12-05 DIAGNOSIS — F331 Major depressive disorder, recurrent, moderate: Secondary | ICD-10-CM

## 2019-12-05 DIAGNOSIS — F411 Generalized anxiety disorder: Secondary | ICD-10-CM | POA: Diagnosis not present

## 2019-12-05 MED ORDER — FLUOXETINE HCL 20 MG PO CAPS: ORAL_CAPSULE | ORAL | 1 refills | Status: DC

## 2019-12-05 NOTE — Progress Notes (Signed)
Nicholas Bruce [id number] 08/23/1990 29 y.o.  Subjective:   Patient ID:  Nicholas Bruce is a 29 y.o. (DOB 07-23-1991) male.  Chief Complaint: No chief complaint on file.   HPI Nicholas Bruce presents to the office today for follow-up of MDD, ADHD, OCD, and ADHD.  Describes mood today as "so ". Pleasant. Mood symptoms - denies depression, anxiety, and irritability. Denies panic attacks. Stating "I'm feeling pretty good, everything has sorted itself out". Recently moved into a new home. Wife [redacted] weeks pregnant. Has not been picking at eyelashes. No longer checking things - "other than normal". Denies "tics" or "squinting eyes". Denies biting nails. Stable interest and motivation. Taking medications as prescribed.  Energy levels mostly stable. Active, has a regular exercise routine - going to the gym 4 to 5 days a week. Works as an Electrical engineer.  Enjoys some usual interests and activities. Married. Lives with wife and 47 and 33/79 year old son - dog. Family local and supportive.  Appetite adequate. Weight stable - 212. Sleeps well most nights. Averages 7 to 8 hours. Focus and concentration difficulties. Diagnosed with ADHD when 15 - does not won't to take medications. Completing tasks. Managing aspects of household. Work going well.  Denies SI or HI. Denies AH or VH.    Review of Systems:  Review of Systems  Musculoskeletal: Negative for gait problem.  Neurological: Negative for tremors.  Psychiatric/Behavioral:       Please refer to HPI    Medications: I have reviewed the patient's current medications.  Current Outpatient Medications  Medication Sig Dispense Refill  . fluorometholone (FML) 0.1 % ophthalmic suspension 1 drop 3 (three) times daily.    Marland Kitchen FLUoxetine (PROZAC) 20 MG capsule Take three capsules daily. 90 capsule 5   No current facility-administered medications for this visit.    Medication Side Effects: None  Allergies: No Known Allergies  No past medical history on  file.  No family history on file.  Social History   Socioeconomic History  . Marital status: Single    Spouse name: Not on file  . Number of children: Not on file  . Years of education: Not on file  . Highest education level: Not on file  Occupational History  . Not on file  Tobacco Use  . Smoking status: Never Smoker  . Smokeless tobacco: Never Used  Substance and Sexual Activity  . Alcohol use: Not on file  . Drug use: Not on file  . Sexual activity: Not on file  Other Topics Concern  . Not on file  Social History Narrative  . Not on file   Social Determinants of Health   Financial Resource Strain:   . Difficulty of Paying Living Expenses:   Food Insecurity:   . Worried About Charity fundraiser in the Last Year:   . Arboriculturist in the Last Year:   Transportation Needs:   . Film/video editor (Medical):   Marland Kitchen Lack of Transportation (Non-Medical):   Physical Activity:   . Days of Exercise per Week:   . Minutes of Exercise per Session:   Stress:   . Feeling of Stress :   Social Connections:   . Frequency of Communication with Friends and Family:   . Frequency of Social Gatherings with Friends and Family:   . Attends Religious Services:   . Active Member of Clubs or Organizations:   . Attends Archivist Meetings:   Marland Kitchen Marital Status:  Intimate Partner Violence:   . Fear of Current or Ex-Partner:   . Emotionally Abused:   Marland Kitchen Physically Abused:   . Sexually Abused:     Past Medical History, Surgical history, Social history, and Family history were reviewed and updated as appropriate.   Please see review of systems for further details on the patient's review from today.   Objective:   Physical Exam:  There were no vitals taken for this visit.  Physical Exam Constitutional:      General: He is not in acute distress. Musculoskeletal:        General: No deformity.  Neurological:     Mental Status: He is alert and oriented to person, place,  and time.     Coordination: Coordination normal.  Psychiatric:        Attention and Perception: Attention and perception normal. He does not perceive auditory or visual hallucinations.        Mood and Affect: Mood normal. Mood is not anxious or depressed. Affect is not labile, blunt, angry or inappropriate.        Speech: Speech normal.        Behavior: Behavior normal.        Thought Content: Thought content normal. Thought content is not paranoid or delusional. Thought content does not include homicidal or suicidal ideation. Thought content does not include homicidal or suicidal plan.        Cognition and Memory: Cognition and memory normal.        Judgment: Judgment normal.     Comments: Insight intact     Lab Review:  No results found for: NA, K, CL, CO2, GLUCOSE, BUN, CREATININE, CALCIUM, PROT, ALBUMIN, AST, ALT, ALKPHOS, BILITOT, GFRNONAA, GFRAA  No results found for: WBC, RBC, HGB, HCT, PLT, MCV, MCH, MCHC, RDW, LYMPHSABS, MONOABS, EOSABS, BASOSABS  No results found for: POCLITH, LITHIUM   No results found for: PHENYTOIN, PHENOBARB, VALPROATE, CBMZ   .res Assessment: Plan:    Plan:  1. Continue Prozac 60mg  capsule daily - increased OCD symptoms.  RTC 6 months  Patient advised to contact office with any questions, adverse effects, or acute worsening in signs and symptoms.   There are no diagnoses linked to this encounter.   Please see After Visit Summary for patient specific instructions.  No future appointments.  No orders of the defined types were placed in this encounter.   -------------------------------

## 2020-01-29 DIAGNOSIS — H18592 Other hereditary corneal dystrophies, left eye: Secondary | ICD-10-CM | POA: Diagnosis not present

## 2020-01-29 DIAGNOSIS — H04123 Dry eye syndrome of bilateral lacrimal glands: Secondary | ICD-10-CM | POA: Diagnosis not present

## 2020-01-29 DIAGNOSIS — H1789 Other corneal scars and opacities: Secondary | ICD-10-CM | POA: Diagnosis not present

## 2020-04-16 ENCOUNTER — Other Ambulatory Visit: Payer: Self-pay

## 2020-04-16 DIAGNOSIS — F429 Obsessive-compulsive disorder, unspecified: Secondary | ICD-10-CM

## 2020-04-16 DIAGNOSIS — F411 Generalized anxiety disorder: Secondary | ICD-10-CM

## 2020-04-16 DIAGNOSIS — F331 Major depressive disorder, recurrent, moderate: Secondary | ICD-10-CM

## 2020-04-16 MED ORDER — FLUOXETINE HCL 20 MG PO CAPS: ORAL_CAPSULE | ORAL | 0 refills | Status: DC

## 2020-06-05 ENCOUNTER — Ambulatory Visit: Payer: BC Managed Care – PPO | Admitting: Adult Health

## 2020-06-19 ENCOUNTER — Ambulatory Visit: Payer: BC Managed Care – PPO | Admitting: Adult Health

## 2020-07-02 ENCOUNTER — Other Ambulatory Visit: Payer: Self-pay

## 2020-07-02 ENCOUNTER — Encounter: Payer: Self-pay | Admitting: Adult Health

## 2020-07-02 ENCOUNTER — Ambulatory Visit (INDEPENDENT_AMBULATORY_CARE_PROVIDER_SITE_OTHER): Payer: BC Managed Care – PPO | Admitting: Adult Health

## 2020-07-02 DIAGNOSIS — F411 Generalized anxiety disorder: Secondary | ICD-10-CM

## 2020-07-02 DIAGNOSIS — Z6831 Body mass index (BMI) 31.0-31.9, adult: Secondary | ICD-10-CM | POA: Diagnosis not present

## 2020-07-02 DIAGNOSIS — F331 Major depressive disorder, recurrent, moderate: Secondary | ICD-10-CM

## 2020-07-02 DIAGNOSIS — F419 Anxiety disorder, unspecified: Secondary | ICD-10-CM | POA: Diagnosis not present

## 2020-07-02 DIAGNOSIS — F429 Obsessive-compulsive disorder, unspecified: Secondary | ICD-10-CM

## 2020-07-02 DIAGNOSIS — Z0001 Encounter for general adult medical examination with abnormal findings: Secondary | ICD-10-CM | POA: Diagnosis not present

## 2020-07-02 DIAGNOSIS — E7849 Other hyperlipidemia: Secondary | ICD-10-CM | POA: Diagnosis not present

## 2020-07-02 MED ORDER — FLUOXETINE HCL 20 MG PO CAPS: ORAL_CAPSULE | ORAL | 3 refills | Status: DC

## 2020-07-02 NOTE — Progress Notes (Signed)
Nicholas Bruce [id number] 05/26/1991 29 y.o.  Subjective:   Patient ID:  Nicholas Bruce is a 28 y.o. (DOB 05-16-1991) male.  Chief Complaint: No chief complaint on file.   HPI Nicholas Bruce presents to the office today for follow-up of MDD, ADHD, OCD, and ADHD.  Describes mood today as "ok". Pleasant. Mood symptoms - denies depression, anxiety, and irritability. Denies panic attacks. Stating "I'm doing pretty goodt". Stressed with traveling back and forth to work - 110 miles one way. Has moved into new home - settled in. Wife 9 months pregnant. Stable interest and motivation. Taking medications as prescribed.  Energy levels mostly stable. Active, has a regular exercise routine - going to the gym 4 to 5 days a week. 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 Married. Lives with wife and 21 and 81/62 year old son - dog. Family local and supportive.  Appetite adequate. Weight stable - 212. Sleeps well most nights. Averages 7 to 8 hours. Focus and concentration difficulties. Diagnosed with ADHD when 15 - does not won't to take medications. Completing tasks. Managing aspects of household. Work going well.  Denies SI or HI. Denies AH or VH.   Review of Systems:  Review of Systems  Musculoskeletal: Negative for gait problem.  Neurological: Negative for tremors.  Psychiatric/Behavioral:       Please refer to HPI    Medications: I have reviewed the patient's current medications.  Current Outpatient Medications  Medication Sig Dispense Refill  . fluorometholone (FML) 0.1 % ophthalmic suspension 1 drop 3 (three) times daily.    Marland Kitchen FLUoxetine (PROZAC) 20 MG capsule Take three capsules daily. 270 capsule 3  . trimethoprim-polymyxin b (POLYTRIM) ophthalmic solution Place 1 drop into the right eye 4 (four) times daily.     No current facility-administered medications for this visit.    Medication Side Effects: None  Allergies: No Known Allergies  No past  medical history on file.  No family history on file.  Social History   Socioeconomic History  . Marital status: Single    Spouse name: Not on file  . Number of children: Not on file  . Years of education: Not on file  . Highest education level: Not on file  Occupational History  . Not on file  Tobacco Use  . Smoking status: Never Smoker  . Smokeless tobacco: Never Used  Substance and Sexual Activity  . Alcohol use: Not on file  . Drug use: Not on file  . Sexual activity: Not on file  Other Topics Concern  . Not on file  Social History Narrative  . Not on file   Social Determinants of Health   Financial Resource Strain:   . Difficulty of Paying Living Expenses: Not on file  Food Insecurity:   . Worried About Programme researcher, broadcasting/film/video in the Last Year: Not on file  . Ran Out of Food in the Last Year: Not on file  Transportation Needs:   . Lack of Transportation (Medical): Not on file  . Lack of Transportation (Non-Medical): Not on file  Physical Activity:   . Days of Exercise per Week: Not on file  . Minutes of Exercise per Session: Not on file  Stress:   . Feeling of Stress : Not on file  Social Connections:   . Frequency of Communication with Friends and Family: Not on file  . Frequency of Social Gatherings with Friends and Family: Not on file  . Attends  Religious Services: Not on file  . Active Member of Clubs or Organizations: Not on file  . Attends Banker Meetings: Not on file  . Marital Status: Not on file  Intimate Partner Violence:   . Fear of Current or Ex-Partner: Not on file  . Emotionally Abused: Not on file  . Physically Abused: Not on file  . Sexually Abused: Not on file    Past Medical History, Surgical history, Social history, and Family history were reviewed and updated as appropriate.   Please see review of systems for further details on the patient's review from today.   Objective:   Physical Exam:  There were no vitals taken for  this visit.  Physical Exam Constitutional:      General: He is not in acute distress. Musculoskeletal:        General: No deformity.  Neurological:     Mental Status: He is alert and oriented to person, place, and time.     Coordination: Coordination normal.  Psychiatric:        Attention and Perception: Attention and perception normal. He does not perceive auditory or visual hallucinations.        Mood and Affect: Mood normal. Mood is not anxious or depressed. Affect is not labile, blunt, angry or inappropriate.        Speech: Speech normal.        Behavior: Behavior normal.        Thought Content: Thought content normal. Thought content is not paranoid or delusional. Thought content does not include homicidal or suicidal ideation. Thought content does not include homicidal or suicidal plan.        Cognition and Memory: Cognition and memory normal.        Judgment: Judgment normal.     Comments: Insight intact     Lab Review:  No results found for: NA, K, CL, CO2, GLUCOSE, BUN, CREATININE, CALCIUM, PROT, ALBUMIN, AST, ALT, ALKPHOS, BILITOT, GFRNONAA, GFRAA  No results found for: WBC, RBC, HGB, HCT, PLT, MCV, MCH, MCHC, RDW, LYMPHSABS, MONOABS, EOSABS, BASOSABS  No results found for: POCLITH, LITHIUM   No results found for: PHENYTOIN, PHENOBARB, VALPROATE, CBMZ   .res Assessment: Plan:    Plan:  1. Continue Prozac 60mg  capsule daily.  RTC 6 months  Patient advised to contact office with any questions, adverse effects, or acute worsening in signs and symptoms.   Diagnoses and all orders for this visit:  Obsessive-compulsive disorder, unspecified type -     FLUoxetine (PROZAC) 20 MG capsule; Take three capsules daily.  Major depressive disorder, recurrent episode, moderate (HCC) -     FLUoxetine (PROZAC) 20 MG capsule; Take three capsules daily.  Generalized anxiety disorder -     FLUoxetine (PROZAC) 20 MG capsule; Take three capsules daily.     Please see After  Visit Summary for patient specific instructions.  No future appointments.  No orders of the defined types were placed in this encounter.   -------------------------------

## 2020-09-25 DIAGNOSIS — J019 Acute sinusitis, unspecified: Secondary | ICD-10-CM | POA: Diagnosis not present

## 2020-09-25 DIAGNOSIS — U071 COVID-19: Secondary | ICD-10-CM | POA: Diagnosis not present

## 2020-09-25 DIAGNOSIS — J209 Acute bronchitis, unspecified: Secondary | ICD-10-CM | POA: Diagnosis not present

## 2020-10-06 DIAGNOSIS — K5900 Constipation, unspecified: Secondary | ICD-10-CM | POA: Diagnosis not present

## 2020-12-30 ENCOUNTER — Ambulatory Visit: Payer: BC Managed Care – PPO | Admitting: Adult Health

## 2021-07-17 ENCOUNTER — Encounter: Payer: Self-pay | Admitting: Emergency Medicine

## 2021-07-17 ENCOUNTER — Other Ambulatory Visit: Payer: Self-pay

## 2021-07-17 ENCOUNTER — Ambulatory Visit
Admission: EM | Admit: 2021-07-17 | Discharge: 2021-07-17 | Disposition: A | Discharge status: Home / Self Care | Payer: BC Managed Care – PPO | Attending: Family Medicine | Admitting: Family Medicine

## 2021-07-17 DIAGNOSIS — R062 Wheezing: Secondary | ICD-10-CM

## 2021-07-17 DIAGNOSIS — J069 Acute upper respiratory infection, unspecified: Secondary | ICD-10-CM

## 2021-07-17 DIAGNOSIS — R509 Fever, unspecified: Secondary | ICD-10-CM

## 2021-07-17 DIAGNOSIS — R000 Tachycardia, unspecified: Secondary | ICD-10-CM

## 2021-07-17 MED ORDER — ALBUTEROL SULFATE HFA 108 (90 BASE) MCG/ACT IN AERS: 2.0000 | INHALATION_SPRAY | RESPIRATORY_TRACT | 0 refills | Status: AC | PRN

## 2021-07-17 MED ORDER — PROMETHAZINE-DM 6.25-15 MG/5ML PO SYRP: 5.0000 mL | ORAL_SOLUTION | Freq: Four times a day (QID) | ORAL | 0 refills | Status: AC | PRN

## 2021-07-17 MED ORDER — PREDNISONE 20 MG PO TABS: 40.0000 mg | ORAL_TABLET | Freq: Every day | ORAL | 0 refills | Status: AC

## 2021-07-17 NOTE — ED Triage Notes (Signed)
Vomiting and diarreaha the day after thanksgiving.  On Tuesday night, sore throat, nasal congestion.  Wife had same symptoms and was placed on an antibiotic and is getting better.

## 2021-07-17 NOTE — ED Provider Notes (Signed)
[facility]    CSN: [id number] Arrival date & time: 07/17/21  1631      History   Chief Complaint No chief complaint on file.   HPI Nicholas Bruce is a 30 y.o. male.   Presenting today with 2 to 3-day history of sore throat, congestion, fatigue, body aches, now with also wheezing, chest tightness, worsening cough.  Was unaware that he had a fever until today.  Denies chest pain, shortness of breath, abdominal pain, current nausea vomiting or diarrhea though last week did have several days of this.  Wife sick with flulike symptoms.  No known pertinent chronic medical problems.   History reviewed. No pertinent past medical history.  There are no problems to display for this patient.   History reviewed. No pertinent surgical history.     Home Medications    Prior to Admission medications   Medication Sig Start Date End Date Taking? Authorizing Provider  albuterol (VENTOLIN HFA) 108 (90 Base) MCG/ACT inhaler Inhale 2 puffs into the lungs every 4 (four) hours as needed for wheezing or shortness of breath. 07/17/21  Yes Particia Nearing, PA-C  predniSONE (DELTASONE) 20 MG tablet Take 2 tablets (40 mg total) by mouth daily with breakfast. 07/17/21  Yes Particia Nearing, PA-C  promethazine-dextromethorphan (PROMETHAZINE-DM) 6.25-15 MG/5ML syrup Take 5 mLs by mouth 4 (four) times daily as needed. 07/17/21  Yes Particia Nearing, PA-C  fluorometholone (FML) 0.1 % ophthalmic suspension 1 drop 3 (three) times daily. 07/05/19   [provider]  FLUoxetine (PROZAC) 20 MG capsule Take three capsules daily. 07/02/20   Mozingo, Thereasa Solo, NP  trimethoprim-polymyxin b (POLYTRIM) ophthalmic solution Place 1 drop into the right eye 4 (four) times daily. 11/09/19   [provider]    Family History History reviewed. No pertinent family history.  Social History Social History   Tobacco Use   Smoking status: Never   Smokeless tobacco: Never      Allergies   Patient has no known allergies.   Review of Systems Review of Systems Per HPI  Physical Exam Triage Vital Signs ED Triage Vitals  Enc Vitals Group     BP 07/17/21 1739 125/64     Pulse Rate 07/17/21 1739 (!) 117     Resp 07/17/21 1739 18     Temp 07/17/21 1739 99.8 F (37.7 C)     Temp Source 07/17/21 1739 Oral     SpO2 07/17/21 1739 92 %     Weight --      Height --      Head Circumference --      Peak Flow --      Pain Score 07/17/21 1741 5     Pain Loc --      Pain Edu? --      Excl. in GC? --    No data found.  Updated Vital Signs BP 125/64 (BP Location: Right Arm)   Pulse (!) 117   Temp 99.8 F (37.7 C) (Oral)   Resp 18   SpO2 92%   Visual Acuity Right Eye Distance:   Left Eye Distance:   Bilateral Distance:    Right Eye Near:   Left Eye Near:    Bilateral Near:     Physical Exam Vitals and nursing note reviewed.  Constitutional:      Appearance: He is well-developed.  HENT:     Head: Atraumatic.     Right Ear: External ear normal.     Left  Ear: External ear normal.     Nose: Rhinorrhea present.     Mouth/Throat:     Pharynx: No oropharyngeal exudate.  Eyes:     Conjunctiva/sclera: Conjunctivae normal.     Pupils: Pupils are equal, round, and reactive to light.  Cardiovascular:     Rate and Rhythm: Normal rate and regular rhythm.  Pulmonary:     Effort: Pulmonary effort is normal. No respiratory distress.     Breath sounds: Wheezing present. No rales.     Comments: Moderate diffuse Musculoskeletal:        General: Normal range of motion.     Cervical back: Normal range of motion and neck supple.  Lymphadenopathy:     Cervical: No cervical adenopathy.  Skin:    General: Skin is warm and dry.  Neurological:     Mental Status: He is alert and oriented to person, place, and time.  Psychiatric:        Behavior: Behavior normal.     UC Treatments / Results  Labs (all labs ordered are listed, but only abnormal  results are displayed) Labs Reviewed  COVID-19, FLU A+B NAA    EKG   Radiology No results found.  Procedures Procedures (including critical care time)  Medications Ordered in UC Medications - No data to display  Initial Impression / Assessment and Plan / UC Course  I have reviewed the triage vital signs and the nursing notes.  Pertinent labs & imaging results that were available during my care of the patient were reviewed by me and considered in my medical decision making (see chart for details).     Tachycardic, low-grade fever in triage, moderate wheezes on exam but speaking in full sentences and breathing comfortably on room air.  Suspect bronchitis secondary to influenza.  COVID and flu testing pending, will start Tamiflu proactively in addition to treating bronchitis with albuterol inhaler as needed, Phenergan DM, prednisone.  Discussed supportive over-the-counter medications and home care.  Work note given.  Return for worsening symptoms.  Final Clinical Impressions(s) / UC Diagnoses   Final diagnoses:  Viral URI with cough  Fever, unspecified  Tachycardia  Wheezing   Discharge Instructions   None    ED Prescriptions     Medication Sig Dispense Auth. Provider   predniSONE (DELTASONE) 20 MG tablet Take 2 tablets (40 mg total) by mouth daily with breakfast. 10 tablet Particia Nearing, PA-C   albuterol (VENTOLIN HFA) 108 (90 Base) MCG/ACT inhaler Inhale 2 puffs into the lungs every 4 (four) hours as needed for wheezing or shortness of breath. 18 g [place], [place]   promethazine-dextromethorphan (PROMETHAZINE-DM) 6.25-15 MG/5ML syrup Take 5 mLs by mouth 4 (four) times daily as needed. 100 mL Particia Nearing, [place]      PDMP not reviewed this encounter.   Particia Nearing, [place] 07/17/21 1827

## 2021-07-18 LAB — COVID-19, FLU A+B NAA
Influenza A, NAA: NOT DETECTED
Influenza B, NAA: NOT DETECTED
SARS-CoV-2, NAA: NOT DETECTED

## 2021-10-07 ENCOUNTER — Other Ambulatory Visit: Payer: Self-pay

## 2021-10-07 ENCOUNTER — Ambulatory Visit: Payer: BC Managed Care – PPO | Admitting: Adult Health

## 2021-10-07 ENCOUNTER — Encounter: Payer: Self-pay | Admitting: Adult Health

## 2021-10-07 DIAGNOSIS — F633 Trichotillomania: Secondary | ICD-10-CM | POA: Diagnosis not present

## 2021-10-07 MED ORDER — SERTRALINE HCL 50 MG PO TABS: 100.0000 mg | ORAL_TABLET | Freq: Every day | ORAL | 5 refills | Status: DC

## 2021-10-07 NOTE — Progress Notes (Signed)
Nicholas Bruce [id number] [birth year] 30 y.o.  Subjective:   Patient ID:  Nicholas Bruce is a 31 y.o. (DOB 1991-02-06) male.  Chief Complaint: No chief complaint on file.   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  Pleasant. Mood symptoms - denies depression, anxiety, and irritability. Denies panic attacks. Stating "I'm doing alright". Would like to restart an SSRI to help with symptoms of trichotillomania. Working closer to home - traveling less. He and family moved into a new home. Family doing well. Stable interest and motivation. Taking medications as prescribed.  Energy levels mostly stable. Active, has a regular exercise routine - going to the gym 4 to 5 days a week. 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 Married. Lives with wife and 2 children- dog. Family local and supportive.  Appetite adequate. Weight stable - 225 pounds - 6". Sleeps well most nights. Averages 7 to 8 hours. Focus and concentration difficulties at times. Diagnosed with ADHD when 15 - does not won't to take medications - negative experience with stimulants. Completing tasks. Managing aspects of household. Work going well.  Denies SI or HI.  Denies AH or VH.  Flowsheet Row ED from 07/17/2021 in [facility] RISK CATEGORY No Risk       Review of Systems:  Review of Systems  Musculoskeletal:  Negative for gait problem.  Neurological:  Negative for tremors.  Psychiatric/Behavioral:         Please refer to HPI   Medications: I have reviewed the patient's current medications.  Current Outpatient Medications  Medication Sig Dispense Refill   sertraline (ZOLOFT) 50 MG tablet Take 2 tablets (100 mg total) by mouth daily. 30 tablet 5   albuterol (VENTOLIN HFA) 108 (90 Base) MCG/ACT inhaler Inhale 2 puffs into the lungs every 4 (four) hours as needed for wheezing or shortness of breath. 18 g 0    fluorometholone (FML) 0.1 % ophthalmic suspension 1 drop 3 (three) times daily.     FLUoxetine (PROZAC) 20 MG capsule Take three capsules daily. 270 capsule 3   predniSONE (DELTASONE) 20 MG tablet Take 2 tablets (40 mg total) by mouth daily with breakfast. 10 tablet 0   promethazine-dextromethorphan (PROMETHAZINE-DM) 6.25-15 MG/5ML syrup Take 5 mLs by mouth 4 (four) times daily as needed. 100 mL 0   trimethoprim-polymyxin b (POLYTRIM) ophthalmic solution Place 1 drop into the right eye 4 (four) times daily.     No current facility-administered medications for this visit.    Medication Side Effects: None  Allergies: No Known Allergies  No past medical history on file.  Past Medical History, Surgical history, Social history, and Family history were reviewed and updated as appropriate.   Please see review of systems for further details on the patient's review from today.   Objective:   Physical Exam:  There were no vitals taken for this visit.  Physical Exam Constitutional:      General: He is not in acute distress. Musculoskeletal:        General: No deformity.  Neurological:     Mental Status: He is alert and oriented to person, place, and time.     Coordination: Coordination normal.  Psychiatric:        Attention and Perception: Attention and perception normal. He does not perceive auditory or visual hallucinations.        Mood and Affect: Mood normal. Mood is not anxious or  depressed. Affect is not labile, blunt, angry or inappropriate.        Speech: Speech normal.        Behavior: Behavior normal.        Thought Content: Thought content normal. Thought content is not paranoid or delusional. Thought content does not include homicidal or suicidal ideation. Thought content does not include homicidal or suicidal plan.        Cognition and Memory: Cognition and memory normal.        Judgment: Judgment normal.     Comments: Insight intact    Lab Review:  No results found for:  NA, K, CL, CO2, GLUCOSE, BUN, CREATININE, CALCIUM, PROT, ALBUMIN, AST, ALT, ALKPHOS, BILITOT, GFRNONAA, GFRAA  No results found for: WBC, RBC, HGB, HCT, PLT, MCV, MCH, MCHC, RDW, LYMPHSABS, MONOABS, EOSABS, BASOSABS  No results found for: POCLITH, LITHIUM   No results found for: PHENYTOIN, PHENOBARB, VALPROATE, CBMZ   .res Assessment: Plan:    Plan:  1. Add Zoloft 50mg  daily.   Discussed NAC  RTC 6 months  Patient advised to contact office with any questions, adverse effects, or acute worsening in signs and symptoms.   Time spent with patient was 25 minutes. Greater than 50% of face to face time with patient was spent on counseling and coordination of care.    Diagnoses and all orders for this visit:  Trichotillomania -     sertraline (ZOLOFT) 50 MG tablet; Take 2 tablets (100 mg total) by mouth daily.     Please see After Visit Summary for patient specific instructions.  Future Appointments  Date Time Provider Department Center  01/04/2022  8:40 AM Kennard Fildes, 01/06/2022, NP [facility] None     No orders of the defined types were placed in this encounter.   -------------------------------

## 2021-10-09 ENCOUNTER — Telehealth: Payer: Self-pay | Admitting: Adult Health

## 2021-10-09 ENCOUNTER — Other Ambulatory Visit: Payer: Self-pay

## 2021-10-09 DIAGNOSIS — F411 Generalized anxiety disorder: Secondary | ICD-10-CM

## 2021-10-09 DIAGNOSIS — F331 Major depressive disorder, recurrent, moderate: Secondary | ICD-10-CM

## 2021-10-09 DIAGNOSIS — F429 Obsessive-compulsive disorder, unspecified: Secondary | ICD-10-CM

## 2021-10-09 MED ORDER — FLUOXETINE HCL 20 MG PO CAPS: ORAL_CAPSULE | ORAL | 0 refills | Status: DC

## 2021-10-09 NOTE — Telephone Encounter (Signed)
Called patient and he said he is having vomiting and diarrhea on the sertraline. He said he was told it might cause a problem. He would like to go back to fluoxetine.

## 2021-10-09 NOTE — Telephone Encounter (Signed)
We can titrate it once we see how he does. Tell him I hope he feels better.

## 2021-10-09 NOTE — Telephone Encounter (Signed)
Ok to send in 20mg  daily.

## 2021-10-09 NOTE — Telephone Encounter (Signed)
Pt LVM has questions on new meds from apt 2/22. Contact # [phone]

## 2021-10-09 NOTE — Telephone Encounter (Signed)
Patient was taking 60 mg qd, can he start back at that dose or does he need to titrate up?

## 2021-10-09 NOTE — Telephone Encounter (Signed)
Rx sent and patient notified.

## 2021-11-08 ENCOUNTER — Other Ambulatory Visit: Payer: Self-pay | Admitting: Adult Health

## 2021-11-08 DIAGNOSIS — F331 Major depressive disorder, recurrent, moderate: Secondary | ICD-10-CM

## 2021-11-08 DIAGNOSIS — F429 Obsessive-compulsive disorder, unspecified: Secondary | ICD-10-CM

## 2021-11-08 DIAGNOSIS — F411 Generalized anxiety disorder: Secondary | ICD-10-CM

## 2021-12-16 ENCOUNTER — Other Ambulatory Visit: Payer: Self-pay | Admitting: Adult Health

## 2021-12-16 DIAGNOSIS — F331 Major depressive disorder, recurrent, moderate: Secondary | ICD-10-CM

## 2021-12-16 DIAGNOSIS — F411 Generalized anxiety disorder: Secondary | ICD-10-CM

## 2021-12-16 DIAGNOSIS — F429 Obsessive-compulsive disorder, unspecified: Secondary | ICD-10-CM

## 2021-12-17 ENCOUNTER — Telehealth: Payer: Self-pay | Admitting: Adult Health

## 2021-12-17 NOTE — Telephone Encounter (Signed)
Called pharmacy and they have printed the label, but have not filled yet. Called patient and let him know.  ?

## 2021-12-17 NOTE — Telephone Encounter (Signed)
Pt LVM saying his refill was denied.  I called back to confirm the medication.  It was the Fluoxetine.  I told him we sent it last night.  He says he got the message this morning.  Will you call to see if there is an issue or not? ? ? ?Next appt 5/22 ? ?

## 2022-01-04 ENCOUNTER — Ambulatory Visit: Payer: BC Managed Care – PPO | Admitting: Adult Health

## 2022-01-18 ENCOUNTER — Other Ambulatory Visit: Payer: Self-pay | Admitting: Adult Health

## 2022-01-18 DIAGNOSIS — F429 Obsessive-compulsive disorder, unspecified: Secondary | ICD-10-CM

## 2022-01-18 DIAGNOSIS — F411 Generalized anxiety disorder: Secondary | ICD-10-CM

## 2022-01-18 DIAGNOSIS — F331 Major depressive disorder, recurrent, moderate: Secondary | ICD-10-CM

## 2022-01-27 ENCOUNTER — Ambulatory Visit: Payer: BC Managed Care – PPO | Admitting: Adult Health

## 2022-01-27 ENCOUNTER — Encounter: Payer: Self-pay | Admitting: Adult Health

## 2022-01-27 DIAGNOSIS — F633 Trichotillomania: Secondary | ICD-10-CM

## 2022-01-27 MED ORDER — FLUOXETINE HCL 20 MG PO CAPS: 20.0000 mg | ORAL_CAPSULE | Freq: Every day | ORAL | 3 refills | Status: DC

## 2022-01-27 NOTE — Progress Notes (Signed)
Nicholas Bruce [id number] 1991-04-16 31 y.o.  Subjective:   Patient ID:  Nicholas Bruce is a 31 y.o. (DOB 08/29/1990) male.  Chief Complaint: No chief complaint on file.   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  Describes mood today as "ok". Pleasant. Mood symptoms - denies depression, anxiety, and irritability. Denies panic attacks. Stating "I'm doing pretty good". Feels like the Prozac works well for him. Family doing well. Stable interest and motivation. Taking medications as prescribed.  Energy levels stable. Active, has a regular exercise routine - going to the gym 4 to 5 days a week. Enjoys some usual interests and activities. Married. Lives with wife and 2 children- dog. Family local and supportive.  Appetite adequate. Weight stable - 215 pounds - 6". Sleeps well most nights. Averages 7 to 8 hours. Focus and concentration difficulties at times. Diagnosed with ADHD at age 43. Completing tasks. Managing aspects of household. 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  Denies AH or VH.   Flowsheet Row ED from 07/17/2021 in [facility] at [facility] RISK CATEGORY No Risk        Review of Systems:  Review of Systems  Musculoskeletal:  Negative for gait problem.  Neurological:  Negative for tremors.  Psychiatric/Behavioral:         Please refer to HPI    Medications: I have reviewed the patient's current medications.  Current Outpatient Medications  Medication Sig Dispense Refill   albuterol (VENTOLIN HFA) 108 (90 Base) MCG/ACT inhaler Inhale 2 puffs into the lungs every 4 (four) hours as needed for wheezing or shortness of breath. 18 g 0   fluorometholone (FML) 0.1 % ophthalmic suspension 1 drop 3 (three) times daily.     FLUoxetine (PROZAC) 20 MG capsule Take 1 capsule (20 mg total) by mouth daily. 90 capsule 3   predniSONE (DELTASONE) 20 MG tablet Take 2 tablets (40 mg total) by mouth  daily with breakfast. 10 tablet 0   promethazine-dextromethorphan (PROMETHAZINE-DM) 6.25-15 MG/5ML syrup Take 5 mLs by mouth 4 (four) times daily as needed. 100 mL 0   trimethoprim-polymyxin b (POLYTRIM) ophthalmic solution Place 1 drop into the right eye 4 (four) times daily.     No current facility-administered medications for this visit.    Medication Side Effects: None  Allergies: No Known Allergies  No past medical history on file.  Past Medical History, Surgical history, Social history, and Family history were reviewed and updated as appropriate.   Please see review of systems for further details on the patient's review from today.   Objective:   Physical Exam:  There were no vitals taken for this visit.  Physical Exam Constitutional:      General: He is not in acute distress. Musculoskeletal:        General: No deformity.  Neurological:     Mental Status: He is alert and oriented to person, place, and time.     Coordination: Coordination normal.  Psychiatric:        Attention and Perception: Attention and perception normal. He does not perceive auditory or visual hallucinations.        Mood and Affect: Mood normal. Mood is not anxious or depressed. Affect is not labile, blunt, angry or inappropriate.        Speech: Speech normal.        Behavior: Behavior normal.        Thought  Content: Thought content normal. Thought content is not paranoid or delusional. Thought content does not include homicidal or suicidal ideation. Thought content does not include homicidal or suicidal plan.        Cognition and Memory: Cognition and memory normal.        Judgment: Judgment normal.     Comments: Insight intact     Lab Review:  No results found for: "NA", "K", "CL", "CO2", "GLUCOSE", "BUN", "CREATININE", "CALCIUM", "PROT", "ALBUMIN", "AST", "ALT", "ALKPHOS", "BILITOT", "GFRNONAA", "GFRAA"  No results found for: "WBC", "RBC", "HGB", "HCT", "PLT", "MCV", "MCH", "MCHC", "RDW",  "LYMPHSABS", "MONOABS", "EOSABS", "BASOSABS"  No results found for: "POCLITH", "LITHIUM"   No results found for: "PHENYTOIN", "PHENOBARB", "VALPROATE", "CBMZ"   .res Assessment: Plan:    Plan:  1. Prozac 20mg  daily  Discussed NAC  RTC 6 months  Patient advised to contact office with any questions, adverse effects, or acute worsening in signs and symptoms.  Time spent with patient was 10 minutes. Greater than 50% of face to face time with patient was spent on counseling and coordination of care.    Diagnoses and all orders for this visit:  Trichotillomania  Other orders -     FLUoxetine (PROZAC) 20 MG capsule; Take 1 capsule (20 mg total) by mouth daily.     Please see After Visit Summary for patient specific instructions.  Future Appointments  Date Time Provider Department Center  07/29/2022  8:00 AM Gabbrielle Mcnicholas, 07/31/2022, NP CP-CP None    No orders of the defined types were placed in this encounter.   -------------------------------

## 2022-02-22 ENCOUNTER — Other Ambulatory Visit: Payer: Self-pay | Admitting: Adult Health

## 2022-05-12 DIAGNOSIS — H04121 Dry eye syndrome of right lacrimal gland: Secondary | ICD-10-CM | POA: Diagnosis not present

## 2022-07-14 DIAGNOSIS — E669 Obesity, unspecified: Secondary | ICD-10-CM | POA: Diagnosis not present

## 2022-07-14 DIAGNOSIS — L309 Dermatitis, unspecified: Secondary | ICD-10-CM | POA: Diagnosis not present

## 2022-07-14 DIAGNOSIS — Z6831 Body mass index (BMI) 31.0-31.9, adult: Secondary | ICD-10-CM | POA: Diagnosis not present

## 2022-07-29 ENCOUNTER — Telehealth (INDEPENDENT_AMBULATORY_CARE_PROVIDER_SITE_OTHER): Payer: BC Managed Care – PPO | Admitting: Adult Health

## 2022-07-29 ENCOUNTER — Encounter: Payer: Self-pay | Admitting: Adult Health

## 2022-07-29 DIAGNOSIS — F633 Trichotillomania: Secondary | ICD-10-CM | POA: Diagnosis not present

## 2022-07-29 MED ORDER — FLUOXETINE HCL 20 MG PO CAPS: 20.0000 mg | ORAL_CAPSULE | Freq: Every day | ORAL | 3 refills | Status: DC

## 2022-07-29 NOTE — Progress Notes (Signed)
Nicholas Bruce [id number] 05-Apr-1991 31 y.o.  Virtual Visit via Video Note  I connected with pt @ on 07/29/22 at  8:00 AM EST by a video enabled telemedicine application and verified that I am speaking with the correct person using two identifiers.   I discussed the limitations of evaluation and management by telemedicine and the availability of in person appointments. The patient expressed understanding and agreed to proceed.  I discussed the assessment and treatment plan with the patient. The patient was provided an opportunity to ask questions and all were answered. The patient agreed with the plan and demonstrated an understanding of the instructions.   The patient was advised to call back or seek an in-person evaluation if the symptoms worsen or if the condition fails to improve as anticipated.  I provided 10 minutes of non-face-to-face time during this encounter.  The patient was located at home.  The provider was located at [facility].   Dorothyann Gibbs, NP   Subjective:   Patient ID:  Nicholas Bruce is a 31 y.o. (DOB [birth year]) male.  Chief Complaint: No chief complaint on file.   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  Describes mood today as "ok". Pleasant. Mood symptoms - denies depression, anxiety, and irritability. Denies panic attacks. Stating "everything has been good". Feels like the Prozac works well for him. Family doing well. Stable interest and motivation. Taking medications as prescribed.  Energy levels stable. Active, has a regular exercise routine - going to the gym 4 to 5 days a week. Enjoys some usual interests and activities. Married. Lives with wife and 2 children- dog. Family local and supportive.  Appetite adequate. Weight loss - 210 pounds - 6". Sleeps well most nights. Averages 7 to 8 hours. Focus and concentration difficulties at times. Diagnosed with ADHD at age 86. Completing tasks. Managing aspects of household. 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  Denies AH or VH.    Review of Systems:  Review of Systems  Musculoskeletal:  Negative for gait problem.  Neurological:  Negative for tremors.  Psychiatric/Behavioral:         Please refer to HPI    Medications: I have reviewed the patient's current medications.  Current Outpatient Medications  Medication Sig Dispense Refill   albuterol (VENTOLIN HFA) 108 (90 Base) MCG/ACT inhaler Inhale 2 puffs into the lungs every 4 (four) hours as needed for wheezing or shortness of breath. 18 g 0   fluorometholone (FML) 0.1 % ophthalmic suspension 1 drop 3 (three) times daily.     FLUoxetine (PROZAC) 20 MG capsule TAKE 1 CAPSULE BY MOUTH DAILY 30 capsule 5   predniSONE (DELTASONE) 20 MG tablet Take 2 tablets (40 mg total) by mouth daily with breakfast. 10 tablet 0   promethazine-dextromethorphan (PROMETHAZINE-DM) 6.25-15 MG/5ML syrup Take 5 mLs by mouth 4 (four) times daily as needed. 100 mL 0   trimethoprim-polymyxin b (POLYTRIM) ophthalmic solution Place 1 drop into the right eye 4 (four) times daily.     No current facility-administered medications for this visit.    Medication Side Effects: None  Allergies: No Known Allergies  No past medical history on file.  No family history on file.  Social History   Socioeconomic History   Marital status: Single    Spouse name: Not on file   Number of children: Not on file   Years of education: Not on file   Highest education level: Not  on file  Occupational History   Not on file  Tobacco Use   Smoking status: Never   Smokeless tobacco: Never  Substance and Sexual Activity   Alcohol use: Not on file   Drug use: Not on file   Sexual activity: Not on file  Other Topics Concern   Not on file  Social History Narrative   Not on file   Social Determinants of Health   Financial Resource Strain: Not on file  Food Insecurity: Not on file  Transportation Needs: Not on  file  Physical Activity: Not on file  Stress: Not on file  Social Connections: Not on file  Intimate Partner Violence: Not on file    Past Medical History, Surgical history, Social history, and Family history were reviewed and updated as appropriate.   Please see review of systems for further details on the patient's review from today.   Objective:   Physical Exam:  There were no vitals taken for this visit.  Physical Exam Constitutional:      General: He is not in acute distress. Musculoskeletal:        General: No deformity.  Neurological:     Mental Status: He is alert and oriented to person, place, and time.     Coordination: Coordination normal.  Psychiatric:        Attention and Perception: Attention and perception normal. He does not perceive auditory or visual hallucinations.        Mood and Affect: Mood normal. Mood is not anxious or depressed. Affect is not labile, blunt, angry or inappropriate.        Speech: Speech normal.        Behavior: Behavior normal.        Thought Content: Thought content normal. Thought content is not paranoid or delusional. Thought content does not include homicidal or suicidal ideation. Thought content does not include homicidal or suicidal plan.        Cognition and Memory: Cognition and memory normal.        Judgment: Judgment normal.     Comments: Insight intact     Lab Review:  No results found for: "NA", "K", "CL", "CO2", "GLUCOSE", "BUN", "CREATININE", "CALCIUM", "PROT", "ALBUMIN", "AST", "ALT", "ALKPHOS", "BILITOT", "GFRNONAA", "GFRAA"  No results found for: "WBC", "RBC", "HGB", "HCT", "PLT", "MCV", "MCH", "MCHC", "RDW", "LYMPHSABS", "MONOABS", "EOSABS", "BASOSABS"  No results found for: "POCLITH", "LITHIUM"   No results found for: "PHENYTOIN", "PHENOBARB", "VALPROATE", "CBMZ"   .res Assessment: Plan:    Plan:  1. Prozac 20mg  daily  Discussed NAC  RTC 6 months  Patient advised to contact office with any questions,  adverse effects, or acute worsening in signs and symptoms.  Time spent with patient was 10 minutes. Greater than 50% of face to face time with patient was spent on counseling and coordination of care.   There are no diagnoses linked to this encounter.   Please see After Visit Summary for patient specific instructions.  Future Appointments  Date Time Provider [place]  07/29/2022  8:00 AM Raesean Bartoletti, Berdie Ogren, NP CP-CP None    No orders of the defined types were placed in this encounter.     -------------------------------

## 2023-08-19 ENCOUNTER — Telehealth: Payer: Self-pay | Admitting: Adult Health

## 2023-08-19 DIAGNOSIS — F633 Trichotillomania: Secondary | ICD-10-CM

## 2023-08-19 MED ORDER — FLUOXETINE HCL 20 MG PO CAPS: 20.0000 mg | ORAL_CAPSULE | Freq: Every day | ORAL | 0 refills | Status: DC

## 2023-08-19 NOTE — Telephone Encounter (Signed)
 Pt called at 4:30p saying he was told he would get a call back this afternoon but hasn't heard back from anyone.  Pls send Fluoxetine  in today if at all possible due to the issues written in the original encounter.  Pt seemed very agitated and said he was sweating from not having his meds for 5 days.  Next appt 1/21

## 2023-08-19 NOTE — Telephone Encounter (Signed)
 Lvm for pt to call and schedule

## 2023-08-19 NOTE — Telephone Encounter (Signed)
 A 30-day supply of Prozac sent to the requested pharmacy. Patient was last seen 07/2022 with RTC in 6 mo.

## 2023-08-19 NOTE — Telephone Encounter (Signed)
 Please schedule pt an appt lv 07/29/22 due back in 6 months.

## 2023-08-19 NOTE — Telephone Encounter (Signed)
 Sie called at 11:34 to requests refill of his fluoxetine .  Hasn't had it for 4 days and feels awful.  He was told he didn't need to come back but will if needed. Send to [facility] [phone] - [place] - [address] AT [facility] [address]

## 2023-09-06 ENCOUNTER — Ambulatory Visit: Payer: BC Managed Care – PPO | Admitting: Adult Health

## 2023-09-06 ENCOUNTER — Encounter: Payer: Self-pay | Admitting: Adult Health

## 2023-09-06 DIAGNOSIS — F633 Trichotillomania: Secondary | ICD-10-CM | POA: Diagnosis not present

## 2023-09-06 MED ORDER — FLUOXETINE HCL 20 MG PO CAPS: 20.0000 mg | ORAL_CAPSULE | Freq: Every day | ORAL | 3 refills | Status: DC

## 2023-09-06 NOTE — Progress Notes (Addendum)
Nicholas Bruce [id number] 12/01/1990 33 y.o.  Subjective:   Patient ID:  Nicholas Bruce is a 33 y.o. (DOB 1991-08-15) male.  Chief Complaint: No chief complaint on file.   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  Describes mood today as "ok". Pleasant. Mood symptoms - denies depression, anxiety, and irritability. Denies panic attacks. Denies worry, rumination and over thinking. Stating "I feel like I'm doing alright". Feels like the Prozac works well for him. Family doing well. Stable interest and motivation. Taking medications as prescribed.  Energy levels stable. Active, has a regular exercise routine. Enjoys some usual interests and activities. Married. Lives with wife and 2 children - dog. Family local and supportive.  Appetite adequate. Weight stable - 215 pounds - 6". Sleeps well most nights. Averages 7 to 8 hours. Focus and concentration difficulties at times. Diagnosed with ADHD at age 10. Completing tasks. Managing aspects of household. 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  Denies AH or VH.    Flowsheet Row ED from 07/17/2021 in [facility] at [facility] RISK CATEGORY No Risk        Review of Systems:  Review of Systems  Musculoskeletal:  Negative for gait problem.  Neurological:  Negative for tremors.  Psychiatric/Behavioral:         Please refer to HPI    Medications: I have reviewed the patient's current medications.  Current Outpatient Medications  Medication Sig Dispense Refill   albuterol (VENTOLIN HFA) 108 (90 Base) MCG/ACT inhaler Inhale 2 puffs into the lungs every 4 (four) hours as needed for wheezing or shortness of breath. 18 g 0   fluorometholone (FML) 0.1 % ophthalmic suspension 1 drop 3 (three) times daily.     FLUoxetine (PROZAC) 20 MG capsule Take 1 capsule (20 mg total) by mouth daily. 30 capsule 0   predniSONE (DELTASONE) 20 MG tablet Take 2 tablets (40 mg  total) by mouth daily with breakfast. 10 tablet 0   promethazine-dextromethorphan (PROMETHAZINE-DM) 6.25-15 MG/5ML syrup Take 5 mLs by mouth 4 (four) times daily as needed. 100 mL 0   trimethoprim-polymyxin b (POLYTRIM) ophthalmic solution Place 1 drop into the right eye 4 (four) times daily.     No current facility-administered medications for this visit.    Medication Side Effects: None  Allergies: No Known Allergies  No past medical history on file.  Past Medical History, Surgical history, Social history, and Family history were reviewed and updated as appropriate.   Please see review of systems for further details on the patient's review from today.   Objective:   Physical Exam:  There were no vitals taken for this visit.  Physical Exam Constitutional:      General: He is not in acute distress. Musculoskeletal:        General: No deformity.  Neurological:     Mental Status: He is alert and oriented to person, place, and time.     Coordination: Coordination normal.  Psychiatric:        Attention and Perception: Attention and perception normal. He does not perceive auditory or visual hallucinations.        Mood and Affect: Affect is not labile, blunt, angry or inappropriate.        Speech: Speech normal.        Behavior: Behavior normal.        Thought Content: Thought content normal. Thought content is not paranoid  or delusional. Thought content does not include homicidal or suicidal ideation. Thought content does not include homicidal or suicidal plan.        Cognition and Memory: Cognition and memory normal.        Judgment: Judgment normal.     Comments: Insight intact     Lab Review:  No results found for: "NA", "K", "CL", "CO2", "GLUCOSE", "BUN", "CREATININE", "CALCIUM", "PROT", "ALBUMIN", "AST", "ALT", "ALKPHOS", "BILITOT", "GFRNONAA", "GFRAA"  No results found for: "WBC", "RBC", "HGB", "HCT", "PLT", "MCV", "MCH", "MCHC", "RDW", "LYMPHSABS", "MONOABS", "EOSABS",  "BASOSABS"  No results found for: "POCLITH", "LITHIUM"   No results found for: "PHENYTOIN", "PHENOBARB", "VALPROATE", "CBMZ"   .res Assessment: Plan:    Plan:  1. Prozac 20mg  daily  Discussed NAC  RTC 1 year  Patient advised to contact office with any questions, adverse effects, or acute worsening in signs and symptoms.  Time spent with patient was 10 minutes. Greater than 50% of face to face time with patient was spent on counseling and coordination of care.   There are no diagnoses linked to this encounter.   Please see After Visit Summary for patient specific instructions.  Future Appointments  Date Time Provider Department Center  09/06/2023 10:30 AM Brinkley Peet, Thereasa Solo, NP CP-CP None    No orders of the defined types were placed in this encounter.   -------------------------------

## 2024-09-05 ENCOUNTER — Encounter: Payer: Self-pay | Admitting: Adult Health

## 2024-09-05 ENCOUNTER — Telehealth: Payer: BC Managed Care – PPO | Admitting: Adult Health

## 2024-09-05 DIAGNOSIS — F633 Trichotillomania: Secondary | ICD-10-CM | POA: Diagnosis not present

## 2024-09-05 MED ORDER — FLUOXETINE HCL 20 MG PO CAPS: 20.0000 mg | ORAL_CAPSULE | Freq: Every day | ORAL | 3 refills | Status: AC

## 2024-09-05 NOTE — Progress Notes (Signed)
 Nicholas Bruce [id number] September 04, 1990 33 y.o.  Virtual Visit via Video Note  I connected with pt @ on 09/05/24 at 10:00 AM EST by a video enabled telemedicine application and verified that I am speaking with the correct person using two identifiers.   I discussed the limitations of evaluation and management by telemedicine and the availability of in person appointments. The patient expressed understanding and agreed to proceed.  I discussed the assessment and treatment plan with the patient. The patient was provided an opportunity to ask questions and all were answered. The patient agreed with the plan and demonstrated an understanding of the instructions.   The patient was advised to call back or seek an in-person evaluation if the symptoms worsen or if the condition fails to improve as anticipated.  I provided 10 minutes of non-face-to-face time during this encounter.  The patient was located at home.  The provider was located at [facility].   Angeline LOISE Sayers, NP   Subjective:   Patient ID:  Nicholas Bruce is a 34 y.o. (DOB Mar 11, 1991) male.  Chief Complaint: No chief complaint on file.   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  Describes mood today as ok. Pleasant. Mood symptoms - denies depression, anxiety, and irritability. Reports stable interest and motivation. Denies panic attacks. Denies worry, rumination and over thinking. Stating I feel like I'm doing ok. Feels like the Prozac  continues to work well for him. Taking medications as prescribed.  Energy levels stable. Active, has a regular exercise routine. Enjoys some usual interests and activities. Married. Lives with wife, 2 children - dog. Family local and supportive.  Appetite adequate. Weight gain - 225 pounds - 6. Sleeps well most nights. Averages 7 to 8 hours. Focus and concentration difficulties at times. Diagnosed with ADHD at age 68. Completing tasks. Managing aspects of household.  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  Denies AH or VH. Denies self harm. Denies substance use.  Review of Systems:  Review of Systems  Musculoskeletal:  Negative for gait problem.  Neurological:  Negative for tremors.  Psychiatric/Behavioral:         Please refer to HPI    Medications: I have reviewed the patient's current medications.  Current Outpatient Medications  Medication Sig Dispense Refill   albuterol  (VENTOLIN  HFA) 108 (90 Base) MCG/ACT inhaler Inhale 2 puffs into the lungs every 4 (four) hours as needed for wheezing or shortness of breath. 18 g 0   fluorometholone (FML) 0.1 % ophthalmic suspension 1 drop 3 (three) times daily.     FLUoxetine  (PROZAC ) 20 MG capsule Take 1 capsule (20 mg total) by mouth daily. 90 capsule 3   predniSONE  (DELTASONE ) 20 MG tablet Take 2 tablets (40 mg total) by mouth daily with breakfast. 10 tablet 0   promethazine -dextromethorphan (PROMETHAZINE -DM) 6.25-15 MG/5ML syrup Take 5 mLs by mouth 4 (four) times daily as needed. 100 mL 0   trimethoprim-polymyxin b (POLYTRIM) ophthalmic solution Place 1 drop into the right eye 4 (four) times daily.     No current facility-administered medications for this visit.    Medication Side Effects: None  Allergies: Allergies[1]  No past medical history on file.  No family history on file.  Social History   Socioeconomic History   Marital status: Single    Spouse name: Not on file   Number of children: Not on file   Years of education: Not on file   Highest education level:  Not on file  Occupational History   Not on file  Tobacco Use   Smoking status: Never   Smokeless tobacco: Never  Substance and Sexual Activity   Alcohol use: Not on file   Drug use: Not on file   Sexual activity: Not on file  Other Topics Concern   Not on file  Social History Narrative   Not on file   Social Drivers of Health   Tobacco Use: Low Risk (09/06/2023)   Patient History     Smoking Tobacco Use: Never    Smokeless Tobacco Use: Never    Passive Exposure: Not on file  Financial Resource Strain: Not on file  Food Insecurity: Not on file  Transportation Needs: Not on file  Physical Activity: Not on file  Stress: Not on file  Social Connections: Not on file  Intimate Partner Violence: Not on file  Depression (EYV7-0): Not on file  Alcohol Screen: Not on file  Housing: Not on file  Utilities: Not on file  Health Literacy: Not on file    Past Medical History, Surgical history, Social history, and Family history were reviewed and updated as appropriate.   Please see review of systems for further details on the patient's review from today.   Objective:   Physical Exam:  There were no vitals taken for this visit.  Physical Exam Constitutional:      General: He is not in acute distress. Musculoskeletal:        General: No deformity.  Neurological:     Mental Status: He is alert and oriented to person, place, and time.     Coordination: Coordination normal.  Psychiatric:        Attention and Perception: Attention and perception normal. He does not perceive auditory or visual hallucinations.        Mood and Affect: Mood normal. Mood is not anxious or depressed. Affect is not labile, blunt, angry or inappropriate.        Speech: Speech normal.        Behavior: Behavior normal.        Thought Content: Thought content normal. Thought content is not paranoid or delusional. Thought content does not include homicidal or suicidal ideation. Thought content does not include homicidal or suicidal plan.        Cognition and Memory: Cognition and memory normal.        Judgment: Judgment normal.     Comments: Insight intact     Lab Review:  No results found for: NA, K, CL, CO2, GLUCOSE, BUN, CREATININE, CALCIUM, PROT, ALBUMIN, AST, ALT, ALKPHOS, BILITOT, GFRNONAA, GFRAA  No results found for: WBC, RBC, HGB, HCT, PLT,  MCV, MCH, MCHC, RDW, LYMPHSABS, MONOABS, EOSABS, BASOSABS  No results found for: POCLITH, LITHIUM   No results found for: PHENYTOIN, PHENOBARB, VALPROATE, CBMZ   .res Assessment: Plan:    Plan:  Continue: Prozac  20mg  daily  RTC 1 year  Patient advised to contact office with any questions, adverse effects, or acute worsening in signs and symptoms.  10 minutes spent dedicated to the care of this patient on the date of this encounter to include pre-visit review of records, ordering of medication, post visit documentation, and face-to-face time with the patient discussing trichotillomania.  Discussed continuing current medication regimen.  There are no diagnoses linked to this encounter.   Please see After Visit Summary for patient specific instructions.  Future Appointments  Date Time Provider Department Center  09/05/2024 10:00 AM Sarah Zerby Nattalie, NP CP-CP  None    No orders of the defined types were placed in this encounter.     -------------------------------     [1] No Known Allergies
